# Patient Record
Sex: Female | Born: 1950 | Race: Asian | Hispanic: No | Marital: Married | Smoking: Never smoker
Health system: Southern US, Community
[De-identification: ages and names within clinical notes are randomized; demographics above are authoritative.]

## PROBLEM LIST (undated history)

## (undated) DIAGNOSIS — F329 Major depressive disorder, single episode, unspecified: Secondary | ICD-10-CM

## (undated) DIAGNOSIS — F32A Depression, unspecified: Secondary | ICD-10-CM

## (undated) DIAGNOSIS — E119 Type 2 diabetes mellitus without complications: Secondary | ICD-10-CM

## (undated) DIAGNOSIS — K219 Gastro-esophageal reflux disease without esophagitis: Secondary | ICD-10-CM

## (undated) DIAGNOSIS — M1990 Unspecified osteoarthritis, unspecified site: Secondary | ICD-10-CM

## (undated) DIAGNOSIS — N390 Urinary tract infection, site not specified: Secondary | ICD-10-CM

## (undated) DIAGNOSIS — E785 Hyperlipidemia, unspecified: Secondary | ICD-10-CM

## (undated) DIAGNOSIS — E079 Disorder of thyroid, unspecified: Secondary | ICD-10-CM

## (undated) HISTORY — DX: Type 2 diabetes mellitus without complications: E11.9

## (undated) HISTORY — DX: Gastro-esophageal reflux disease without esophagitis: K21.9

## (undated) HISTORY — DX: Major depressive disorder, single episode, unspecified: F32.9

## (undated) HISTORY — DX: Depression, unspecified: F32.A

## (undated) HISTORY — DX: Disorder of thyroid, unspecified: E07.9

## (undated) HISTORY — DX: Urinary tract infection, site not specified: N39.0

## (undated) HISTORY — DX: Unspecified osteoarthritis, unspecified site: M19.90

## (undated) HISTORY — DX: Hyperlipidemia, unspecified: E78.5

## (undated) HISTORY — PX: ESOPHAGOGASTRODUODENOSCOPY: SHX1529

---

## 2012-11-11 ENCOUNTER — Ambulatory Visit: Payer: Self-pay | Admitting: Family Medicine

## 2013-04-14 ENCOUNTER — Ambulatory Visit: Payer: Self-pay | Admitting: Family Medicine

## 2014-01-08 ENCOUNTER — Ambulatory Visit: Payer: Self-pay | Admitting: Family Medicine

## 2014-01-15 ENCOUNTER — Ambulatory Visit: Payer: Self-pay | Admitting: Internal Medicine

## 2014-01-15 LAB — CBC CANCER CENTER
Basophil: 1 %
HCT: 35.7 % (ref 35.0–47.0)
HGB: 10.9 g/dL — AB (ref 12.0–16.0)
Lymphocytes: 25 %
MCH: 21.1 pg — AB (ref 26.0–34.0)
MCHC: 30.6 g/dL — ABNORMAL LOW (ref 32.0–36.0)
MCV: 69 fL — AB (ref 80–100)
Monocytes: 8 %
Platelet: 24 x10 3/mm — CL (ref 150–440)
RBC: 5.19 10*6/uL (ref 3.80–5.20)
RDW: 14.7 % — AB (ref 11.5–14.5)
Segmented Neutrophils: 66 %
WBC: 6.7 x10 3/mm (ref 3.6–11.0)

## 2014-01-15 LAB — PROTIME-INR
INR: 0.9
PROTHROMBIN TIME: 11.6 s (ref 11.5–14.7)

## 2014-01-15 LAB — APTT: Activated PTT: 28.2 secs (ref 23.6–35.9)

## 2014-01-15 LAB — FOLATE: FOLIC ACID: 46.4 ng/mL (ref 3.1–100.0)

## 2014-01-15 LAB — FIBRIN DEGRADATION PROD.(ARMC ONLY): Fibrin Degradation Prod.: <10 ug/ml (ref 2.1–7.7)

## 2014-01-15 LAB — FIBRINOGEN: Fibrinogen: 249 mg/dL (ref 210–470)

## 2014-01-22 LAB — CBC CANCER CENTER
Basophil #: 0.1 x10 3/mm (ref 0.0–0.1)
Basophil %: 0.6 %
EOS ABS: 0.1 x10 3/mm (ref 0.0–0.7)
Eosinophil %: 0.5 %
HCT: 36 % (ref 35.0–47.0)
HGB: 11.1 g/dL — ABNORMAL LOW (ref 12.0–16.0)
LYMPHS ABS: 2.6 x10 3/mm (ref 1.0–3.6)
Lymphocyte %: 18.3 %
MCH: 20.8 pg — ABNORMAL LOW (ref 26.0–34.0)
MCHC: 30.8 g/dL — ABNORMAL LOW (ref 32.0–36.0)
MCV: 68 fL — ABNORMAL LOW (ref 80–100)
MONOS PCT: 5.1 %
Monocyte #: 0.7 x10 3/mm (ref 0.2–0.9)
NEUTROS PCT: 75.5 %
Neutrophil #: 10.8 x10 3/mm — ABNORMAL HIGH (ref 1.4–6.5)
Platelet: 115 x10 3/mm — ABNORMAL LOW (ref 150–440)
RBC: 5.33 10*6/uL — ABNORMAL HIGH (ref 3.80–5.20)
RDW: 14.6 % — ABNORMAL HIGH (ref 11.5–14.5)
WBC: 14.3 x10 3/mm — ABNORMAL HIGH (ref 3.6–11.0)

## 2014-02-04 ENCOUNTER — Ambulatory Visit: Payer: Self-pay | Admitting: Internal Medicine

## 2014-02-05 LAB — CBC CANCER CENTER
BASOS ABS: 0.1 x10 3/mm (ref 0.0–0.1)
Basophil %: 1.4 %
EOS ABS: 0.1 x10 3/mm (ref 0.0–0.7)
Eosinophil %: 1.9 %
HCT: 32.5 % — ABNORMAL LOW (ref 35.0–47.0)
HGB: 9.8 g/dL — ABNORMAL LOW (ref 12.0–16.0)
LYMPHS ABS: 2 x10 3/mm (ref 1.0–3.6)
Lymphocyte %: 32.4 %
MCH: 20.7 pg — AB (ref 26.0–34.0)
MCHC: 30.1 g/dL — AB (ref 32.0–36.0)
MCV: 69 fL — ABNORMAL LOW (ref 80–100)
MONO ABS: 0.4 x10 3/mm (ref 0.2–0.9)
Monocyte %: 6.8 %
Neutrophil #: 3.5 x10 3/mm (ref 1.4–6.5)
Neutrophil %: 57.5 %
PLATELETS: 65 x10 3/mm — AB (ref 150–440)
RBC: 4.72 10*6/uL (ref 3.80–5.20)
RDW: 14.8 % — ABNORMAL HIGH (ref 11.5–14.5)
WBC: 6 x10 3/mm (ref 3.6–11.0)

## 2014-02-19 LAB — CBC CANCER CENTER
Basophil #: 0.1 x10 3/mm (ref 0.0–0.1)
Basophil %: 1.2 %
EOS PCT: 1.5 %
Eosinophil #: 0.1 x10 3/mm (ref 0.0–0.7)
HCT: 34.2 % — ABNORMAL LOW (ref 35.0–47.0)
HGB: 10.7 g/dL — AB (ref 12.0–16.0)
LYMPHS ABS: 2.2 x10 3/mm (ref 1.0–3.6)
LYMPHS PCT: 32.8 %
MCH: 21.2 pg — ABNORMAL LOW (ref 26.0–34.0)
MCHC: 31.4 g/dL — ABNORMAL LOW (ref 32.0–36.0)
MCV: 68 fL — ABNORMAL LOW (ref 80–100)
Monocyte #: 0.5 x10 3/mm (ref 0.2–0.9)
Monocyte %: 7.2 %
Neutrophil #: 3.9 x10 3/mm (ref 1.4–6.5)
Neutrophil %: 57.3 %
Platelet: 86 x10 3/mm — ABNORMAL LOW (ref 150–440)
RBC: 5.06 10*6/uL (ref 3.80–5.20)
RDW: 14.7 % — ABNORMAL HIGH (ref 11.5–14.5)
WBC: 6.8 x10 3/mm (ref 3.6–11.0)

## 2014-03-07 ENCOUNTER — Ambulatory Visit: Payer: Self-pay | Admitting: Internal Medicine

## 2014-03-18 LAB — CBC CANCER CENTER
BASOS PCT: 0.9 %
Basophil #: 0.1 x10 3/mm (ref 0.0–0.1)
EOS ABS: 0.1 x10 3/mm (ref 0.0–0.7)
Eosinophil %: 1.5 %
HCT: 33.7 % — ABNORMAL LOW (ref 35.0–47.0)
HGB: 10.6 g/dL — ABNORMAL LOW (ref 12.0–16.0)
Lymphocyte #: 2.2 x10 3/mm (ref 1.0–3.6)
Lymphocyte %: 36 %
MCH: 20.8 pg — ABNORMAL LOW (ref 26.0–34.0)
MCHC: 31.4 g/dL — AB (ref 32.0–36.0)
MCV: 66 fL — ABNORMAL LOW (ref 80–100)
Monocyte #: 0.4 x10 3/mm (ref 0.2–0.9)
Monocyte %: 7.2 %
NEUTROS ABS: 3.3 x10 3/mm (ref 1.4–6.5)
NEUTROS PCT: 54.4 %
Platelet: 97 x10 3/mm — ABNORMAL LOW (ref 150–440)
RBC: 5.1 10*6/uL (ref 3.80–5.20)
RDW: 14.6 % — AB (ref 11.5–14.5)
WBC: 6.1 x10 3/mm (ref 3.6–11.0)

## 2014-03-23 LAB — PROT IMMUNOELECTROPHORES(ARMC)

## 2014-04-06 ENCOUNTER — Ambulatory Visit: Payer: Self-pay | Admitting: Internal Medicine

## 2014-04-15 LAB — CBC CANCER CENTER
BASOS PCT: 1 %
Basophil #: 0.1 x10 3/mm (ref 0.0–0.1)
Eosinophil #: 0.1 x10 3/mm (ref 0.0–0.7)
Eosinophil %: 1.5 %
HCT: 35.4 % (ref 35.0–47.0)
HGB: 10.9 g/dL — AB (ref 12.0–16.0)
Lymphocyte #: 2.2 x10 3/mm (ref 1.0–3.6)
Lymphocyte %: 26.3 %
MCH: 20.6 pg — ABNORMAL LOW (ref 26.0–34.0)
MCHC: 30.8 g/dL — AB (ref 32.0–36.0)
MCV: 67 fL — ABNORMAL LOW (ref 80–100)
MONO ABS: 0.5 x10 3/mm (ref 0.2–0.9)
Monocyte %: 5.7 %
Neutrophil #: 5.5 x10 3/mm (ref 1.4–6.5)
Neutrophil %: 65.5 %
Platelet: 158 x10 3/mm (ref 150–440)
RBC: 5.3 10*6/uL — ABNORMAL HIGH (ref 3.80–5.20)
RDW: 15.2 % — AB (ref 11.5–14.5)
WBC: 8.5 x10 3/mm (ref 3.6–11.0)

## 2014-05-07 ENCOUNTER — Ambulatory Visit: Payer: Self-pay | Admitting: Internal Medicine

## 2015-01-28 ENCOUNTER — Other Ambulatory Visit: Payer: Self-pay | Admitting: Family Medicine

## 2015-01-28 DIAGNOSIS — M810 Age-related osteoporosis without current pathological fracture: Secondary | ICD-10-CM

## 2015-01-28 DIAGNOSIS — Z1231 Encounter for screening mammogram for malignant neoplasm of breast: Secondary | ICD-10-CM

## 2015-02-10 ENCOUNTER — Ambulatory Visit
Admission: RE | Admit: 2015-02-10 | Discharge: 2015-02-10 | Disposition: A | Discharge status: Home / Self Care | Payer: No Typology Code available for payment source | Source: Ambulatory Visit | Attending: Family Medicine | Admitting: Family Medicine

## 2015-02-10 DIAGNOSIS — M8580 Other specified disorders of bone density and structure, unspecified site: Secondary | ICD-10-CM | POA: Insufficient documentation

## 2015-02-10 DIAGNOSIS — M810 Age-related osteoporosis without current pathological fracture: Secondary | ICD-10-CM | POA: Insufficient documentation

## 2015-02-10 DIAGNOSIS — Z1231 Encounter for screening mammogram for malignant neoplasm of breast: Secondary | ICD-10-CM | POA: Diagnosis not present

## 2015-12-13 ENCOUNTER — Ambulatory Visit (INDEPENDENT_AMBULATORY_CARE_PROVIDER_SITE_OTHER): Payer: BLUE CROSS/BLUE SHIELD | Admitting: Family

## 2015-12-13 ENCOUNTER — Encounter: Payer: Self-pay | Admitting: Family

## 2015-12-13 VITALS — BP 138/70 | HR 71 | Temp 98.1°F | Wt 130.6 lb

## 2015-12-13 DIAGNOSIS — Z0000 Encounter for general adult medical examination without abnormal findings: Secondary | ICD-10-CM

## 2015-12-13 DIAGNOSIS — M810 Age-related osteoporosis without current pathological fracture: Secondary | ICD-10-CM

## 2015-12-13 DIAGNOSIS — M1990 Unspecified osteoarthritis, unspecified site: Secondary | ICD-10-CM | POA: Diagnosis not present

## 2015-12-13 DIAGNOSIS — E119 Type 2 diabetes mellitus without complications: Secondary | ICD-10-CM

## 2015-12-13 DIAGNOSIS — E785 Hyperlipidemia, unspecified: Secondary | ICD-10-CM

## 2015-12-13 DIAGNOSIS — K219 Gastro-esophageal reflux disease without esophagitis: Secondary | ICD-10-CM

## 2015-12-13 MED ORDER — DICLOFENAC SODIUM 1 % TD GEL: 4.0000 g | Freq: Four times a day (QID) | TRANSDERMAL | 3 refills | Status: DC

## 2015-12-13 NOTE — Assessment & Plan Note (Signed)
On statin. Pending lipid panel.

## 2015-12-13 NOTE — Assessment & Plan Note (Signed)
Generalized, predominately in patient's hands. Trial of Voltaren gel. Encouraged continued exercise, and heat.

## 2015-12-13 NOTE — Assessment & Plan Note (Signed)
On Fosamax since 2016. Due for DEXA scan 2018. Will follow.

## 2015-12-13 NOTE — Patient Instructions (Signed)
Pleasure meeting you. We will notify you of the results.  Let's treat conservatively as we discussed.   Over-the-counter medications you may try for arthritic pain include:   ThermaCare patches   Capsaicin cream   Icy hot  Home exercises as we discussed below/handout.  If conservative treatment doesn't yield results, we will consider physical therapy, consult to Sports Medicine/Orthopedics for further evaluation, and imaging.   If there is no improvement in your symptoms, or if there is any worsening of symptoms, or if you have any additional concerns, please return for re-evaluation; or, if we are closed, consider going to the Emergency Room for evaluation if symptoms urgent.  Menopause is a normal process in which your reproductive ability comes to an end. This process happens gradually over a span of months to years, usually between the ages of 77 and 56. Menopause is complete when you have missed 12 consecutive menstrual periods. It is important to talk with your health care provider about some of the most common conditions that affect postmenopausal women, such as heart disease, cancer, and bone loss (osteoporosis). Adopting a healthy lifestyle and getting preventive care can help to promote your health and wellness. Those actions can also lower your chances of developing some of these common conditions. WHAT SHOULD I KNOW ABOUT MENOPAUSE? During menopause, you may experience a number of symptoms, such as:  Moderate-to-severe hot flashes.  Night sweats.  Decrease in sex drive.  Mood swings.  Headaches.  Tiredness.  Irritability.  Memory problems.  Insomnia. Choosing to treat or not to treat menopausal changes is an individual decision that you make with your health care provider. WHAT SHOULD I KNOW ABOUT HORMONE REPLACEMENT THERAPY AND SUPPLEMENTS? Hormone therapy products are effective for treating symptoms that are associated with menopause, such as hot flashes and  night sweats. Hormone replacement carries certain risks, especially as you become older. If you are thinking about using estrogen or estrogen with progestin treatments, discuss the benefits and risks with your health care provider. WHAT SHOULD I KNOW ABOUT HEART DISEASE AND STROKE? Heart disease, heart attack, and stroke become more likely as you age. This may be due, in part, to the hormonal changes that your body experiences during menopause. These can affect how your body processes dietary fats, triglycerides, and cholesterol. Heart attack and stroke are both medical emergencies. There are many things that you can do to help prevent heart disease and stroke:  Have your blood pressure checked at least every 1-2 years. High blood pressure causes heart disease and increases the risk of stroke.  If you are 19-22 years old, ask your health care provider if you should take aspirin to prevent a heart attack or a stroke.  Do not use any tobacco products, including cigarettes, chewing tobacco, or electronic cigarettes. If you need help quitting, ask your health care provider.  It is important to eat a healthy diet and maintain a healthy weight.  Be sure to include plenty of vegetables, fruits, low-fat dairy products, and lean protein.  Avoid eating foods that are high in solid fats, added sugars, or salt (sodium).  Get regular exercise. This is one of the most important things that you can do for your health.  Try to exercise for at least 150 minutes each week. The type of exercise that you do should increase your heart rate and make you sweat. This is known as moderate-intensity exercise.  Try to do strengthening exercises at least twice each week. Do these in addition  to the moderate-intensity exercise.  Know your numbers.Ask your health care provider to check your cholesterol and your blood glucose. Continue to have your blood tested as directed by your health care provider. WHAT SHOULD I KNOW  ABOUT CANCER SCREENING? There are several types of cancer. Take the following steps to reduce your risk and to catch any cancer development as early as possible. Breast Cancer  Practice breast self-awareness.  This means understanding how your breasts normally appear and feel.  It also means doing regular breast self-exams. Let your health care provider know about any changes, no matter how small.  If you are 40 or older, have a clinician do a breast exam (clinical breast exam or CBE) every year. Depending on your age, family history, and medical history, it may be recommended that you also have a yearly breast X-ray (mammogram).  If you have a family history of breast cancer, talk with your health care provider about genetic screening.  If you are at high risk for breast cancer, talk with your health care provider about having an MRI and a mammogram every year.  Breast cancer (BRCA) gene test is recommended for women who have family members with BRCA-related cancers. Results of the assessment will determine the need for genetic counseling and BRCA1 and for BRCA2 testing. BRCA-related cancers include these types:  Breast. This occurs in males or females.  Ovarian.  Tubal. This may also be called fallopian tube cancer.  Cancer of the abdominal or pelvic lining (peritoneal cancer).  Prostate.  Pancreatic. Cervical, Uterine, and Ovarian Cancer Your health care provider may recommend that you be screened regularly for cancer of the pelvic organs. These include your ovaries, uterus, and vagina. This screening involves a pelvic exam, which includes checking for microscopic changes to the surface of your cervix (Pap test).  For women ages 21-65, health care providers may recommend a pelvic exam and a Pap test every three years. For women ages 54-65, they may recommend the Pap test and pelvic exam, combined with testing for human papilloma virus (HPV), every five years. Some types of HPV  increase your risk of cervical cancer. Testing for HPV may also be done on women of any age who have unclear Pap test results.  Other health care providers may not recommend any screening for nonpregnant women who are considered low risk for pelvic cancer and have no symptoms. Ask your health care provider if a screening pelvic exam is right for you.  If you have had past treatment for cervical cancer or a condition that could lead to cancer, you need Pap tests and screening for cancer for at least 20 years after your treatment. If Pap tests have been discontinued for you, your risk factors (such as having a new sexual partner) need to be reassessed to determine if you should start having screenings again. Some women have medical problems that increase the chance of getting cervical cancer. In these cases, your health care provider may recommend that you have screening and Pap tests more often.  If you have a family history of uterine cancer or ovarian cancer, talk with your health care provider about genetic screening.  If you have vaginal bleeding after reaching menopause, tell your health care provider.  There are currently no reliable tests available to screen for ovarian cancer. Lung Cancer Lung cancer screening is recommended for adults 48-56 years old who are at high risk for lung cancer because of a history of smoking. A yearly low-dose CT scan  of the lungs is recommended if you:  Currently smoke.  Have a history of at least 30 pack-years of smoking and you currently smoke or have quit within the past 15 years. A pack-year is smoking an average of one pack of cigarettes per day for one year. Yearly screening should:  Continue until it has been 15 years since you quit.  Stop if you develop a health problem that would prevent you from having lung cancer treatment. Colorectal Cancer  This type of cancer can be detected and can often be prevented.  Routine colorectal cancer screening  usually begins at age 75 and continues through age 64.  If you have risk factors for colon cancer, your health care provider may recommend that you be screened at an earlier age.  If you have a family history of colorectal cancer, talk with your health care provider about genetic screening.  Your health care provider may also recommend using home test kits to check for hidden blood in your stool.  A small camera at the end of a tube can be used to examine your colon directly (sigmoidoscopy or colonoscopy). This is done to check for the earliest forms of colorectal cancer.  Direct examination of the colon should be repeated every 5-10 years until age 7. However, if early forms of precancerous polyps or small growths are found or if you have a family history or genetic risk for colorectal cancer, you may need to be screened more often. Skin Cancer  Check your skin from head to toe regularly.  Monitor any moles. Be sure to tell your health care provider:  About any new moles or changes in moles, especially if there is a change in a mole's shape or color.  If you have a mole that is larger than the size of a pencil eraser.  If any of your family members has a history of skin cancer, especially at a young age, talk with your health care provider about genetic screening.  Always use sunscreen. Apply sunscreen liberally and repeatedly throughout the day.  Whenever you are outside, protect yourself by wearing long sleeves, pants, a wide-brimmed hat, and sunglasses. WHAT SHOULD I KNOW ABOUT OSTEOPOROSIS? Osteoporosis is a condition in which bone destruction happens more quickly than new bone creation. After menopause, you may be at an increased risk for osteoporosis. To help prevent osteoporosis or the bone fractures that can happen because of osteoporosis, the following is recommended:  If you are 36-87 years old, get at least 1,000 mg of calcium and at least 600 mg of vitamin D per day.  If  you are older than age 49 but younger than age 66, get at least 1,200 mg of calcium and at least 600 mg of vitamin D per day.  If you are older than age 38, get at least 1,200 mg of calcium and at least 800 mg of vitamin D per day. Smoking and excessive alcohol intake increase the risk of osteoporosis. Eat foods that are rich in calcium and vitamin D, and do weight-bearing exercises several times each week as directed by your health care provider. WHAT SHOULD I KNOW ABOUT HOW MENOPAUSE AFFECTS [place]? Depression may occur at any age, but it is more common as you become older. Common symptoms of depression include:  Low or sad mood.  Changes in sleep patterns.  Changes in appetite or eating patterns.  Feeling an overall lack of motivation or enjoyment of activities that you previously enjoyed.  Frequent  crying spells. Talk with your health care provider if you think that you are experiencing depression. WHAT SHOULD I KNOW ABOUT IMMUNIZATIONS? It is important that you get and maintain your immunizations. These include:  Tetanus, diphtheria, and pertussis (Tdap) booster vaccine.  Influenza every year before the flu season begins.  Pneumonia vaccine.  Shingles vaccine. Your health care provider may also recommend other immunizations.   This information is not intended to replace advice given to you by your health care provider. Make sure you discuss any questions you have with your health care provider.   Document Released: 06/15/2005 Document Revised: 05/14/2014 Document Reviewed: 12/24/2013 Elsevier Interactive Patient Education [employer].

## 2015-12-13 NOTE — Progress Notes (Addendum)
Subjective:    Patient ID: Janet Moyer, female    DOB: 02/01/1951, 65 y.o.   MRN: [id number]  CC: Janet Maplesnjana D Woodbeck is a 65 y.o. female who presents today for physical exam.    HPI: Here to establish care, prior care with [facility]. Requesting records. Accompanied by daughter. 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 Has been by hematology in past.  Overall feels well. Only complaint is general joint pain and arthritis over past year. Knees, ankles, hip, and low which prevents her from exercise. Hasn't tried any medication. Walks two miles each morning.     Colorectal Cancer Screening: UTD. Last 2009, normal per patient. Not due until 10 years.  Breast Cancer Screening: Mammogram UTD, 2017. Normal. No h/o abnormal.  Cervical Cancer Screening: UTD. 01/2015, normal. Had had prior abnormal.  Bone Health screening/DEXA for 65+: 02/2015 T score -3.5 at spine, osteoporotic. On fosamax weekly. Started treatment 2016.   Immunizations       Tetanus -UTD.          Pneumococcal - 2014.  Hepatitis C screening - Declines; had done with [facility]- Declines; had done with [facility]: Screening labs today. Exercise: Gets regular exercise.  Alcohol use: None Smoking/tobacco use: Nonsmoker.  Regular dental exams: In need of dental exam. Wears seat belt: Yes.  HISTORY:  Past Medical History:  Diagnosis Date  . Arthritis   . Depression   . Diabetes mellitus without complication (HCC)   . GERD (gastroesophageal reflux disease)   . Hyperlipidemia   . Hyperlipidemia   . Thyroid disorder   . UTI (lower urinary tract infection)     Past Surgical History:  Procedure Laterality Date  . CESAREAN SECTION    . ESOPHAGOGASTRODUODENOSCOPY     Family History  Problem Relation Age of Onset  . Hyperlipidemia Mother   . Hypertension Mother   . Diabetes Mother   . Hyperlipidemia Father   . Stroke Father   . Hypertension Father   . Diabetes Father       ALLERGIES: Review of  patient's allergies indicates not on file.  No current outpatient prescriptions on file prior to visit.   No current facility-administered medications on file prior to visit.     Social History  Substance Use Topics  . Smoking status: Never Smoker  . Smokeless tobacco: Never Used  . Alcohol use No    Review of Systems  Constitutional: Negative for chills and fever.  Respiratory: Negative for cough.   Cardiovascular: Negative for chest pain and palpitations.  Gastrointestinal: Negative for nausea and vomiting.  Musculoskeletal: Positive for back pain. Negative for gait problem, joint swelling and myalgias.  All other systems negative.     Objective:    BP 138/70 (BP Location: Left Arm, Patient Position: Sitting, Cuff Size: Normal)   Pulse 71   Temp 98.1 F (36.7 C) (Oral)   Wt 130 lb 9.6 oz (59.2 kg)   SpO2 99%   BP Readings from Last 3 Encounters:  12/13/15 138/70   Wt Readings from Last 3 Encounters:  12/13/15 130 lb 9.6 oz (59.2 kg)    Physical Exam  Constitutional: She appears well-developed and well-nourished.  Eyes: Conjunctivae are normal.  Cardiovascular: Normal rate, regular rhythm, normal heart sounds and normal pulses.   Pulmonary/Chest: Effort normal and breath sounds normal. She has no wheezes. She has no rhonchi. She has no rales.  Musculoskeletal:  Grip strength normal and symmetric bilaterally. Noted ulnar deviation bilaterally.  Neurological: She is alert.  Skin: Skin is warm and dry.  Psychiatric: She has a normal mood and affect. Her speech is normal and behavior is normal. Thought content normal.  Vitals reviewed.      Assessment & Plan:   Problem List Items Addressed This Visit      Digestive   GERD (gastroesophageal reflux disease)    Well-controlled on omeprazole. Will follow.      Relevant Medications   omeprazole (PRILOSEC) 40 MG capsule     Endocrine   Type 2 diabetes mellitus (HCC)    Patient is currently on metformin.  Pending A1c.      Relevant Medications   simvastatin (ZOCOR) 10 MG tablet   metFORMIN (GLUCOPHAGE) 500 MG tablet     Musculoskeletal and Integument   Arthritis    Generalized, predominately in patient's hands. Trial of Voltaren gel. Encouraged continued exercise, and heat.      Relevant Medications   diclofenac sodium (VOLTAREN) 1 % GEL   Osteoporosis    On Fosamax since 2016. Due for DEXA scan 2018. Will follow.       Relevant Medications   Calcium Carbonate-Vitamin D 600-400 MG-UNIT tablet   Cholecalciferol (VITAMIN D3) 1000 units CAPS   alendronate (FOSAMAX) 70 MG tablet     Other   Routine physical examination - Primary    Still awaiting records. However per patient and daughter patient is up-to-date on screening. She is taking Fosamax. We will do screening labs today.      Relevant Orders   CBC with Differential/Platelet   Comprehensive metabolic panel   Hemoglobin A1c   Lipid panel   Microalbumin / creatinine urine ratio   TSH   VITAMIN D 25 Hydroxy (Vit-D Deficiency, Fractures)   Hyperlipidemia    On statin. Pending lipid panel.      Relevant Medications   simvastatin (ZOCOR) 10 MG tablet    Other Visit Diagnoses   None.      I am having Ms. Suits start on diclofenac sodium. I am also having her maintain her Calcium Carbonate-Vitamin D, Cyanocobalamin, simvastatin, metFORMIN, omeprazole, CENTRUM SILVER ULTRA WOMENS, Vitamin D3, alendronate, and folic acid.   Meds ordered this encounter  Medications  . Calcium Carbonate-Vitamin D 600-400 MG-UNIT tablet    Sig: Take by mouth.  . Cyanocobalamin (RA VITAMIN B-12 TR) 1000 MCG TBCR    Sig: Take by mouth.  . simvastatin (ZOCOR) 10 MG tablet    Sig: Take by mouth.  . metFORMIN (GLUCOPHAGE) 500 MG tablet    Sig: TAKE 1 TABLET BY MOUTH EVERY DAY WITH BREAKFAST FOR 1 WEEK THEN TAKE 1 TABLET BY MOUTH TWICE DAILY  . omeprazole (PRILOSEC) 40 MG capsule    Sig: Take by mouth.  . Multiple Vitamins-Minerals  (CENTRUM SILVER ULTRA WOMENS) TABS    Sig: Take by mouth.  . Cholecalciferol (VITAMIN D3) 1000 units CAPS    Sig: Take by mouth.  Marland Kitchen alendronate (FOSAMAX) 70 MG tablet    Sig: Take by mouth.  . folic acid (FOLVITE) 800 MCG tablet    Sig: Take 400 mcg by mouth daily.  . diclofenac sodium (VOLTAREN) 1 % GEL    Sig: Apply 4 g topically 4 (four) times daily.    Dispense:  1 Tube    Refill:  3    Order Specific Question:   Supervising Provider    Answer:   Sherlene Shams [[id number]]    Return precautions given.   Risks, benefits, and alternatives  of the medications and treatment plan prescribed today were discussed, and patient expressed understanding.   Education regarding symptom management and diagnosis given to patient on AVS.   Continue to follow with Rennie Plowman, FNP for routine health maintenance.   Janet Maples and I agreed with plan.   Rennie Plowman, FNP

## 2015-12-13 NOTE — Assessment & Plan Note (Signed)
Well-controlled on omeprazole. Will follow.

## 2015-12-13 NOTE — Assessment & Plan Note (Signed)
Patient is currently on metformin. Pending A1c.

## 2015-12-13 NOTE — Assessment & Plan Note (Signed)
Still awaiting records. However per patient and daughter patient is up-to-date on screening. She is taking Fosamax. We will do screening labs today.

## 2015-12-14 ENCOUNTER — Other Ambulatory Visit (INDEPENDENT_AMBULATORY_CARE_PROVIDER_SITE_OTHER): Payer: BLUE CROSS/BLUE SHIELD

## 2015-12-14 DIAGNOSIS — Z0000 Encounter for general adult medical examination without abnormal findings: Secondary | ICD-10-CM | POA: Diagnosis not present

## 2015-12-14 LAB — CBC WITH DIFFERENTIAL/PLATELET
BASOS ABS: 0 10*3/uL (ref 0.0–0.1)
BASOS PCT: 0.5 % (ref 0.0–3.0)
EOS ABS: 0.1 10*3/uL (ref 0.0–0.7)
Eosinophils Relative: 2.4 % (ref 0.0–5.0)
HEMATOCRIT: 33.4 % — AB (ref 36.0–46.0)
HEMOGLOBIN: 10.6 g/dL — AB (ref 12.0–15.0)
LYMPHS PCT: 35.4 % (ref 12.0–46.0)
Lymphs Abs: 1.9 10*3/uL (ref 0.7–4.0)
MCHC: 31.6 g/dL (ref 30.0–36.0)
MCV: 67.4 fl — ABNORMAL LOW (ref 78.0–100.0)
Monocytes Absolute: 0.3 10*3/uL (ref 0.1–1.0)
Monocytes Relative: 6.4 % (ref 3.0–12.0)
Neutro Abs: 3 10*3/uL (ref 1.4–7.7)
Neutrophils Relative %: 55.3 % (ref 43.0–77.0)
Platelets: 217 10*3/uL (ref 150.0–400.0)
RBC: 4.96 Mil/uL (ref 3.87–5.11)
RDW: 15.3 % (ref 11.5–15.5)
WBC: 5.4 10*3/uL (ref 4.0–10.5)

## 2015-12-14 LAB — LDL CHOLESTEROL, DIRECT: LDL DIRECT: 77 mg/dL

## 2015-12-14 LAB — COMPREHENSIVE METABOLIC PANEL
ALBUMIN: 4 g/dL (ref 3.5–5.2)
ALT: 24 U/L (ref 0–35)
AST: 16 U/L (ref 0–37)
Alkaline Phosphatase: 51 U/L (ref 39–117)
BILIRUBIN TOTAL: 0.7 mg/dL (ref 0.2–1.2)
BUN: 15 mg/dL (ref 6–23)
CALCIUM: 9.4 mg/dL (ref 8.4–10.5)
CO2: 31 mEq/L (ref 19–32)
CREATININE: 0.61 mg/dL (ref 0.40–1.20)
Chloride: 101 mEq/L (ref 96–112)
GFR: 105.12 mL/min (ref >60.00)
Glucose, Bld: 183 mg/dL — ABNORMAL HIGH (ref 70–99)
Potassium: 4.1 mEq/L (ref 3.5–5.1)
Sodium: 136 mEq/L (ref 135–145)
TOTAL PROTEIN: 7 g/dL (ref 6.0–8.3)

## 2015-12-14 LAB — LIPID PANEL
CHOL/HDL RATIO: 3
CHOLESTEROL: 159 mg/dL (ref 0–200)
HDL: 51.8 mg/dL (ref >39.00)
NonHDL: 106.83
Triglycerides: 209 mg/dL — ABNORMAL HIGH (ref 0.0–149.0)
VLDL: 41.8 mg/dL — ABNORMAL HIGH (ref 0.0–40.0)

## 2015-12-14 LAB — TSH: TSH: 5.14 u[IU]/mL — ABNORMAL HIGH (ref 0.35–4.50)

## 2015-12-14 LAB — HEMOGLOBIN A1C: HEMOGLOBIN A1C: 8.6 % — AB (ref 4.6–6.5)

## 2015-12-14 LAB — MICROALBUMIN / CREATININE URINE RATIO
CREATININE, U: 47.6 mg/dL
MICROALB/CREAT RATIO: 1.5 mg/g (ref 0.0–30.0)

## 2015-12-14 LAB — VITAMIN D 25 HYDROXY (VIT D DEFICIENCY, FRACTURES): VITD: 93.52 ng/mL (ref 30.00–100.00)

## 2015-12-15 ENCOUNTER — Telehealth: Payer: Self-pay | Admitting: Family

## 2015-12-15 ENCOUNTER — Telehealth: Payer: Self-pay

## 2015-12-15 DIAGNOSIS — D649 Anemia, unspecified: Secondary | ICD-10-CM

## 2015-12-15 DIAGNOSIS — E785 Hyperlipidemia, unspecified: Secondary | ICD-10-CM

## 2015-12-15 DIAGNOSIS — R7989 Other specified abnormal findings of blood chemistry: Secondary | ICD-10-CM

## 2015-12-15 DIAGNOSIS — E1110 Type 2 diabetes mellitus with ketoacidosis without coma: Secondary | ICD-10-CM

## 2015-12-15 MED ORDER — SIMVASTATIN 20 MG PO TABS: 20.0000 mg | ORAL_TABLET | Freq: Every evening | ORAL | 4 refills | Status: DC

## 2015-12-15 NOTE — Telephone Encounter (Signed)
Completed PA for Voltren on Cover my meds.

## 2015-12-15 NOTE — Telephone Encounter (Signed)
See result note.  

## 2016-01-18 ENCOUNTER — Ambulatory Visit (INDEPENDENT_AMBULATORY_CARE_PROVIDER_SITE_OTHER): Payer: BLUE CROSS/BLUE SHIELD | Admitting: Family

## 2016-01-18 ENCOUNTER — Encounter: Payer: Self-pay | Admitting: Family

## 2016-01-18 VITALS — BP 116/72 | HR 86 | Temp 97.8°F | Wt 129.8 lb

## 2016-01-18 DIAGNOSIS — E785 Hyperlipidemia, unspecified: Secondary | ICD-10-CM

## 2016-01-18 DIAGNOSIS — Z23 Encounter for immunization: Secondary | ICD-10-CM

## 2016-01-18 DIAGNOSIS — D649 Anemia, unspecified: Secondary | ICD-10-CM

## 2016-01-18 DIAGNOSIS — D563 Thalassemia minor: Secondary | ICD-10-CM | POA: Diagnosis not present

## 2016-01-18 DIAGNOSIS — E119 Type 2 diabetes mellitus without complications: Secondary | ICD-10-CM | POA: Diagnosis not present

## 2016-01-18 DIAGNOSIS — L819 Disorder of pigmentation, unspecified: Secondary | ICD-10-CM | POA: Insufficient documentation

## 2016-01-18 LAB — CBC WITH DIFFERENTIAL/PLATELET
BASOS ABS: 0 10*3/uL (ref 0.0–0.1)
Basophils Relative: 0.4 % (ref 0.0–3.0)
EOS ABS: 0.1 10*3/uL (ref 0.0–0.7)
Eosinophils Relative: 2.9 % (ref 0.0–5.0)
HCT: 34.8 % — ABNORMAL LOW (ref 36.0–46.0)
Hemoglobin: 11.2 g/dL — ABNORMAL LOW (ref 12.0–15.0)
LYMPHS ABS: 1.8 10*3/uL (ref 0.7–4.0)
LYMPHS PCT: 36 % (ref 12.0–46.0)
MCHC: 32.1 g/dL (ref 30.0–36.0)
MCV: 66 fl — ABNORMAL LOW (ref 78.0–100.0)
Monocytes Absolute: 0.4 10*3/uL (ref 0.1–1.0)
Monocytes Relative: 7.4 % (ref 3.0–12.0)
NEUTROS ABS: 2.7 10*3/uL (ref 1.4–7.7)
NEUTROS PCT: 53.3 % (ref 43.0–77.0)
PLATELETS: 254 10*3/uL (ref 150.0–400.0)
RBC: 5.28 Mil/uL — ABNORMAL HIGH (ref 3.87–5.11)
RDW: 15.5 % (ref 11.5–15.5)
WBC: 5 10*3/uL (ref 4.0–10.5)

## 2016-01-18 LAB — IBC PANEL
IRON: 104 ug/dL (ref 42–145)
Saturation Ratios: 31.3 % (ref 20.0–50.0)
TRANSFERRIN: 237 mg/dL (ref 212.0–360.0)

## 2016-01-18 LAB — B12 AND FOLATE PANEL
Folate: >23.7 ng/mL (ref >5.9)
Vitamin B-12: 929 pg/mL — ABNORMAL HIGH (ref 211–911)

## 2016-01-18 LAB — FERRITIN: Ferritin: 63.9 ng/mL (ref 10.0–291.0)

## 2016-01-18 LAB — TSH: TSH: 5.74 u[IU]/mL — AB (ref 0.35–4.50)

## 2016-01-18 MED ORDER — ONETOUCH ULTRA MINI W/DEVICE KIT: PACK | 0 refills | Status: DC

## 2016-01-18 MED ORDER — GLUCOSE BLOOD VI STRP: ORAL_STRIP | 3 refills | Status: DC

## 2016-01-18 MED ORDER — METFORMIN HCL 1000 MG PO TABS: 1000.0000 mg | ORAL_TABLET | Freq: Two times a day (BID) | ORAL | 3 refills | Status: DC

## 2016-01-18 MED ORDER — ONETOUCH LANCETS MISC: 3 refills | Status: DC

## 2016-01-18 NOTE — Assessment & Plan Note (Signed)
Uncontrolled. Increased metformin. Referred to diabetic nutritionist and ordered diabetic supplies for patient to test blood sugar at home. Follow up in 3 months.

## 2016-01-18 NOTE — Assessment & Plan Note (Addendum)
Stable. Advised to not take iron. No acute bleeding source identified. Hemoglobin appears at patient's baseline. Microcytic anemia supports thalassemia. Pending anemia studies. Advised patient to go back to hematology due to my concern for blood malignancies after learning the h/o of thrombocytopenia as well. I would like to ensure that patient has been appropriately worked up and followed by heme-onc. Daughter and patient agreed with this plan.

## 2016-01-18 NOTE — Assessment & Plan Note (Addendum)
Nonspecific at this time. Chronic. Lesion not c/w petechiae or ecchymosis. Will follow and advised patient to discuss with heme-onc as well.

## 2016-01-18 NOTE — Patient Instructions (Signed)
As we discussed, I would like for you to contact hematology to ensure you were released from care. Usually they like to follow.   Basic Carbohydrate Counting for Diabetes Mellitus Carbohydrate counting is a method for keeping track of the amount of carbohydrates you eat. Eating carbohydrates naturally increases the level of sugar (glucose) in your blood, so it is important for you to know the amount that is okay for you to have in every meal. Carbohydrate counting helps keep the level of glucose in your blood within normal limits. The amount of carbohydrates allowed is different for every person. A dietitian can help you calculate the amount that is right for you. Once you know the amount of carbohydrates you can have, you can count the carbohydrates in the foods you want to eat. Carbohydrates are found in the following foods:  Grains, such as breads and cereals.  Dried beans and soy products.  Starchy vegetables, such as potatoes, peas, and corn.  Fruit and fruit juices.  Milk and yogurt.  Sweets and snack foods, such as cake, cookies, candy, chips, soft drinks, and fruit drinks. CARBOHYDRATE COUNTING There are two ways to count the carbohydrates in your food. You can use either of the methods or a combination of both. Reading the "Nutrition Facts" on Packaged Food The "Nutrition Facts" is an area that is included on the labels of almost all packaged food and beverages in the [place]. It includes the serving size of that food or beverage and information about the nutrients in each serving of the food, including the grams (g) of carbohydrate per serving.  Decide the number of servings of this food or beverage that you will be able to eat or drink. Multiply that number of servings by the number of grams of carbohydrate that is listed on the label for that serving. The total will be the amount of carbohydrates you will be having when you eat or drink this food or beverage. Learning Standard  Serving Sizes of Food When you eat food that is not packaged or does not include "Nutrition Facts" on the label, you need to measure the servings in order to count the amount of carbohydrates.A serving of most carbohydrate-rich foods contains about 15 g of carbohydrates. The following list includes serving sizes of carbohydrate-rich foods that provide 15 g ofcarbohydrate per serving:   1 slice of bread (1 oz) or 1 six-inch tortilla.    of a hamburger bun or English muffin.  4-6 crackers.   cup unsweetened dry cereal.    cup hot cereal.   cup rice or pasta.    cup mashed potatoes or  of a large baked potato.  1 cup fresh fruit or one small piece of fruit.    cup canned or frozen fruit or fruit juice.  1 cup milk.   cup plain fat-free yogurt or yogurt sweetened with artificial sweeteners.   cup cooked dried beans or starchy vegetable, such as peas, corn, or potatoes.  Decide the number of standard-size servings that you will eat. Multiply that number of servings by 15 (the grams of carbohydrates in that serving). For example, if you eat 2 cups of strawberries, you will have eaten 2 servings and 30 g of carbohydrates (2 servings x 15 g = 30 g). For foods such as soups and casseroles, in which more than one food is mixed in, you will need to count the carbohydrates in each food that is included. EXAMPLE OF CARBOHYDRATE COUNTING Sample Dinner  3 oz chicken breast.   cup of brown rice.   cup of corn.  1 cup milk.   1 cup strawberries with sugar-free whipped topping.  Carbohydrate Calculation Step 1: Identify the foods that contain carbohydrates:   Rice.   Corn.   Milk.   Strawberries. Step 2:Calculate the number of servings eaten of each:   2 servings of rice.   1 serving of corn.   1 serving of milk.   1 serving of strawberries. Step 3: Multiply each of those number of servings by 15 g:   2 servings of rice x 15 g = 30 g.   1  serving of corn x 15 g = 15 g.   1 serving of milk x 15 g = 15 g.   1 serving of strawberries x 15 g = 15 g. Step 4: Add together all of the amounts to find the total grams of carbohydrates eaten: 30 g + 15 g + 15 g + 15 g = 75 g.   This information is not intended to replace advice given to you by your health care provider. Make sure you discuss any questions you have with your health care provider.   Document Released: 04/23/2005 Document Revised: 05/14/2014 Document Reviewed: 03/20/2013 Elsevier Interactive Patient Education [employer].

## 2016-01-18 NOTE — Progress Notes (Signed)
Pre visit review using our clinic review tool, if applicable. No additional management support is needed unless otherwise documented below in the visit note. 

## 2016-01-18 NOTE — Progress Notes (Signed)
Subjective:    Patient ID: Janet Moyer, female    DOB: 1951-02-09, 65 y.o.   MRN: [id number]  CC: Janet Moyer is a 65 y.o. female who presents today for follow up.   HPI: Patient for follow-up on chronic disease. Accompanied by her daughter who is an Therapist, sports.  Anemia- h/o thalessmia minor and work up per daughter; takes folic acid and not iron. Has been worked up by [facility] [phone] by Dr Juliann Mule for low platelets and hemoglobin. At one time had black stool and had an EGD, no dark stools now. No SOB, fatigue.  Per daughter, 01/15/2014 hemoglobin 10.9. No blood in stool, vaginal bleeding.   HLD- increased Zocor at last visit. Compliant with medication.   DM- A1C 8.6. Doesn't check blood sugars as glucometer broken.  TSH elevated at last visit. no palpitations or h/o thyroid disease.   Missing records; patient states she colonoscopy in 2009, normal per patient.      HISTORY:  Past Medical History:  Diagnosis Date  . Arthritis   . Depression   . Diabetes mellitus without complication ([place])   . GERD (gastroesophageal reflux disease)   . Hyperlipidemia   . Hyperlipidemia   . Thyroid disorder   . UTI (lower urinary tract infection)    Past Surgical History:  Procedure Laterality Date  . CESAREAN SECTION    . ESOPHAGOGASTRODUODENOSCOPY     Family History  Problem Relation Age of Onset  . Hyperlipidemia Mother   . Hypertension Mother   . Diabetes Mother   . Hyperlipidemia Father   . Stroke Father   . Hypertension Father   . Diabetes Father     Allergies: Review of patient's allergies indicates not on file. Current Outpatient Prescriptions on File Prior to Visit  Medication Sig Dispense Refill  . alendronate (FOSAMAX) 70 MG tablet Take by mouth.    . Calcium Carbonate-Vitamin D 600-400 MG-UNIT tablet Take by mouth.    . Cholecalciferol (VITAMIN D3) 1000 units CAPS Take by mouth.    . Cyanocobalamin (RA VITAMIN B-12 TR) 1000 MCG TBCR Take by mouth.    . diclofenac  sodium (VOLTAREN) 1 % GEL Apply 4 g topically 4 (four) times daily. 1 Tube 3  . folic acid (FOLVITE) 412 MCG tablet Take 400 mcg by mouth daily.    . Multiple Vitamins-Minerals (CENTRUM SILVER ULTRA WOMENS) TABS Take by mouth.    Marland Kitchen omeprazole (PRILOSEC) 40 MG capsule Take by mouth.    . simvastatin (ZOCOR) 20 MG tablet Take 1 tablet (20 mg total) by mouth every evening. 90 tablet 4   No current facility-administered medications on file prior to visit.     Social History  Substance Use Topics  . Smoking status: Never Smoker  . Smokeless tobacco: Never Used  . Alcohol use No    Review of Systems  Constitutional: Negative for chills and fever.  Respiratory: Negative for cough and shortness of breath.   Cardiovascular: Negative for chest pain and palpitations.  Gastrointestinal: Negative for nausea and vomiting.  Skin: Positive for color change (bilateral calves have darkened area of skin for years; seen by hematologist).  Neurological: Negative for dizziness.  Hematological: Does not bruise/bleed easily.      Objective:    BP 116/72   Pulse 86   Temp 97.8 F (36.6 C) (Oral)   Wt 129 lb 12.8 oz (58.9 kg)   SpO2 98%  BP Readings from Last 3 Encounters:  01/18/16 116/72  12/13/15 138/70  Wt Readings from Last 3 Encounters:  01/18/16 129 lb 12.8 oz (58.9 kg)  12/13/15 130 lb 9.6 oz (59.2 kg)    Physical Exam  Constitutional: She appears well-developed and well-nourished.  Eyes: Conjunctivae are normal.  Cardiovascular: Normal rate, regular rhythm, normal heart sounds and normal pulses.   Pulmonary/Chest: Effort normal and breath sounds normal. She has no wheezes. She has no rhonchi. She has no rales.  Neurological: She is alert.  Skin: Skin is warm and dry.  Hyperpigmented areas ventral aspect of BLE. Nonblanchable, nontender.   Psychiatric: She has a normal mood and affect. Her speech is normal and behavior is normal. Thought content normal.  Vitals reviewed.        Assessment & Plan:   Problem List Items Addressed This Visit      Endocrine   Type 2 diabetes mellitus ([place])    Uncontrolled. Increased metformin. Referred to diabetic nutritionist and ordered diabetic supplies for patient to test blood sugar at home. Follow up in 3 months.       Relevant Medications   metFORMIN (GLUCOPHAGE) 1000 MG tablet   glucose blood test strip   Blood Glucose Monitoring Suppl (ONE TOUCH ULTRA MINI) w/Device KIT   ONE TOUCH LANCETS MISC   Other Relevant Orders   TSH   Ambulatory referral to diabetic education     Musculoskeletal and Integument   Hyperpigmentation of skin    Nonspecific at this time. Chronic. Lesion not c/w petechiae or ecchymosis. Will follow and advised patient to discuss with heme-onc as well.         Other   Hyperlipidemia    Compliant with medication. Will continue current regimen and recheck lipids at follow up.       Thalassemia minor    Stable. Advised to not take iron. No acute bleeding source identified. Hemoglobin appears at patient's baseline. Microcytic anemia supports thalassemia. Pending anemia studies. Advised patient to go back to hematology due to my concern for blood malignancies after learning the h/o of thrombocytopenia as well. I would like to ensure that patient has been appropriately worked up and followed by heme-onc. Daughter and patient agreed with this plan.        Other Visit Diagnoses    Anemia, unspecified anemia type    -  Primary   Relevant Orders   CBC with Differential/Platelet   Ferritin   IBC panel   B12 and Folate Panel   Intrinsic Factor Antibodies   Methylmalonic acid, serum   Homocysteine   Pathologist smear review       I have discontinued Ms. Blanchette's metFORMIN. I am also having her start on metFORMIN, glucose blood, ONE TOUCH ULTRA MINI, and ONE TOUCH LANCETS. Additionally, I am having her maintain her Calcium Carbonate-Vitamin D, Cyanocobalamin, omeprazole, CENTRUM SILVER ULTRA WOMENS,  Vitamin D3, alendronate, folic acid, diclofenac sodium, and simvastatin.   Meds ordered this encounter  Medications  . metFORMIN (GLUCOPHAGE) 1000 MG tablet    Sig: Take 1 tablet (1,000 mg total) by mouth 2 (two) times daily with a meal.    Dispense:  180 tablet    Refill:  3    Order Specific Question:   Supervising Provider    Answer:   Deborra Medina L [[id number]]  . glucose blood test strip    Sig: Use to test blood sugar twice daily.    Dispense:  100 each    Refill:  3    Order Specific Question:   Supervising Provider  Answer:   TULLO, TERESA L [[id number]]  . Blood Glucose Monitoring Suppl (ONE TOUCH ULTRA MINI) w/Device KIT    Sig: Use to test blood sugar twice daily.    Dispense:  1 each    Refill:  0    Order Specific Question:   Supervising Provider    Answer:   Deborra Medina L [[id number]]  . ONE TOUCH LANCETS MISC    Sig: Use to test blood sugar twice daily.    Dispense:  100 each    Refill:  3    Order Specific Question:   Supervising Provider    Answer:   Crecencio Mc [[id number]]    Return precautions given.   Risks, benefits, and alternatives of the medications and treatment plan prescribed today were discussed, and patient expressed understanding.   Education regarding symptom management and diagnosis given to patient on AVS.  Continue to follow with Mable Paris, FNP for routine health maintenance.   Tammy Sours and I agreed with plan.   Mable Paris, FNP  Total of 25 minutes spent with patient, greater than 50% of which was spent in discussion of  Of thalassemia minor(new diagnosis to me) , and adequacy of her past work up.

## 2016-01-18 NOTE — Assessment & Plan Note (Signed)
Compliant with medication. Will continue current regimen and recheck lipids at follow up.

## 2016-01-19 ENCOUNTER — Other Ambulatory Visit: Payer: Self-pay | Admitting: Family

## 2016-01-19 DIAGNOSIS — E039 Hypothyroidism, unspecified: Secondary | ICD-10-CM

## 2016-01-19 LAB — INTRINSIC FACTOR ANTIBODIES: INTRINSIC FACTOR: NEGATIVE

## 2016-01-19 LAB — PATHOLOGIST SMEAR REVIEW

## 2016-01-19 LAB — HOMOCYSTEINE: Homocysteine: 7.1 umol/L (ref <10.4)

## 2016-01-19 NOTE — Progress Notes (Signed)
Calculated 1.516mcg x 58.9 kg= 94.24 mcg.   Will check T4 and then start 75 mcg synthroid if t4 low.

## 2016-01-20 LAB — METHYLMALONIC ACID, SERUM: Methylmalonic Acid, Quant: 77 nmol/L — ABNORMAL LOW (ref 87–318)

## 2016-01-24 ENCOUNTER — Telehealth: Payer: Self-pay | Admitting: *Deleted

## 2016-01-24 NOTE — Telephone Encounter (Signed)
Patient was informed of results.  Patient understood and no questions, comments, or concerns at this time.  

## 2016-01-24 NOTE — Telephone Encounter (Signed)
Please advise, thanks.

## 2016-01-24 NOTE — Telephone Encounter (Signed)
Patient requested lab results  Pt contact [phone]

## 2016-01-27 ENCOUNTER — Other Ambulatory Visit (INDEPENDENT_AMBULATORY_CARE_PROVIDER_SITE_OTHER): Payer: BLUE CROSS/BLUE SHIELD

## 2016-01-27 DIAGNOSIS — E039 Hypothyroidism, unspecified: Secondary | ICD-10-CM | POA: Diagnosis not present

## 2016-01-27 LAB — T4, FREE: Free T4: 0.84 ng/dL (ref 0.60–1.60)

## 2016-01-30 ENCOUNTER — Other Ambulatory Visit: Payer: Self-pay | Admitting: Family

## 2016-01-30 DIAGNOSIS — E038 Other specified hypothyroidism: Secondary | ICD-10-CM

## 2016-01-30 DIAGNOSIS — E039 Hypothyroidism, unspecified: Secondary | ICD-10-CM

## 2016-02-01 ENCOUNTER — Telehealth: Payer: Self-pay

## 2016-02-01 LAB — THYROTROPIN RECEPTOR AUTOABS

## 2016-02-01 NOTE — Telephone Encounter (Signed)
[facility] was unable to perform Thyrotropin receptor test. Specimen was sent incorrectly.  Attempted to call patient. Left VM asking patient to call back and schedule lab visit to have lab re-drawn.

## 2016-02-20 ENCOUNTER — Ambulatory Visit: Payer: BLUE CROSS/BLUE SHIELD | Admitting: Family

## 2016-02-20 DIAGNOSIS — Z0289 Encounter for other administrative examinations: Secondary | ICD-10-CM

## 2016-02-21 ENCOUNTER — Encounter: Payer: Self-pay | Admitting: Family

## 2016-02-21 ENCOUNTER — Ambulatory Visit (INDEPENDENT_AMBULATORY_CARE_PROVIDER_SITE_OTHER): Payer: BLUE CROSS/BLUE SHIELD | Admitting: Family

## 2016-02-21 VITALS — BP 118/78 | HR 79 | Temp 98.1°F | Wt 128.3 lb

## 2016-02-21 DIAGNOSIS — E119 Type 2 diabetes mellitus without complications: Secondary | ICD-10-CM

## 2016-02-21 DIAGNOSIS — D563 Thalassemia minor: Secondary | ICD-10-CM

## 2016-02-21 DIAGNOSIS — E039 Hypothyroidism, unspecified: Secondary | ICD-10-CM | POA: Diagnosis not present

## 2016-02-21 DIAGNOSIS — K219 Gastro-esophageal reflux disease without esophagitis: Secondary | ICD-10-CM

## 2016-02-21 DIAGNOSIS — E038 Other specified hypothyroidism: Secondary | ICD-10-CM | POA: Insufficient documentation

## 2016-02-21 DIAGNOSIS — Z1231 Encounter for screening mammogram for malignant neoplasm of breast: Secondary | ICD-10-CM

## 2016-02-21 DIAGNOSIS — Z1239 Encounter for other screening for malignant neoplasm of breast: Secondary | ICD-10-CM

## 2016-02-21 LAB — HEMOGLOBIN A1C: Hgb A1c MFr Bld: 7.8 % — ABNORMAL HIGH (ref 4.6–6.5)

## 2016-02-21 MED ORDER — OMEPRAZOLE 20 MG PO CPDR: 20.0000 mg | DELAYED_RELEASE_CAPSULE | Freq: Every day | ORAL | 3 refills | Status: DC

## 2016-02-21 NOTE — Patient Instructions (Signed)
Mammogram  Enjoy [place]!

## 2016-02-21 NOTE — Assessment & Plan Note (Signed)
Pending A1c. Compliant with medication.

## 2016-02-21 NOTE — Assessment & Plan Note (Signed)
Asymptomatic. Will continue to follow and redraw TSH, T4 when patient returns from [place] in December.

## 2016-02-21 NOTE — Assessment & Plan Note (Signed)
Symptomatically stable. Slight improvement in hemoglobin from prior. Patient continues to take B12, folate and daughter states that when she returns from [place] in December that she will follow-up with hematology as I requested.

## 2016-02-21 NOTE — Assessment & Plan Note (Signed)
Stable. No red flag symptoms. Refill omeprazole 20 mg daily. Encouraged patient to avoid spicy, sour foods and to be sure that she is not laying down after large meals. Per daughter, patient has had a recent EGD and is UTD on colonoscopy both which were normal. I do not have records of this and have requested records again from daughter.

## 2016-02-21 NOTE — Progress Notes (Signed)
Pre visit review using our clinic review tool, if applicable. No additional management support is needed unless otherwise documented below in the visit note. 

## 2016-02-21 NOTE — Progress Notes (Signed)
Subjective:    Patient ID: Janet Moyer, female    DOB: 07-Oct-1950, 65 y.o.   MRN: [id number]  CC: Janet Moyer is a 65 y.o. female who presents today for follow up.   HPI: She is here for follow up and would like refill for acid reflux medication.   GERD- had been following with GI however hasn't seen in one year. Endorses epigastric burning which responds well to omeprazole. Associated with spicy and sour foods. No hoarseness, difficulty swallowing, unintentional weightloss. Daughter states EGD normal 2 years ago. Colonoscopy 2009 normal. Will repeat in 10 years.   Anemia- No SOB, dizziness. Plans to see heme after trip to [place].   DM- Following DM diet.   Hypothyroidism subclinical- No palpitations, hot/cold intolerance.     HISTORY:  Past Medical History:  Diagnosis Date  . Arthritis   . Depression   . Diabetes mellitus without complication ([place])   . GERD (gastroesophageal reflux disease)   . Hyperlipidemia   . Hyperlipidemia   . Thyroid disorder   . UTI (lower urinary tract infection)    Past Surgical History:  Procedure Laterality Date  . CESAREAN SECTION    . ESOPHAGOGASTRODUODENOSCOPY     Family History  Problem Relation Age of Onset  . Hyperlipidemia Mother   . Hypertension Mother   . Diabetes Mother   . Hyperlipidemia Father   . Stroke Father   . Hypertension Father   . Diabetes Father     Allergies: Review of patient's allergies indicates not on file. Current Outpatient Prescriptions on File Prior to Visit  Medication Sig Dispense Refill  . Blood Glucose Monitoring Suppl (ONE TOUCH ULTRA MINI) w/Device KIT Use to test blood sugar twice daily. 1 each 0  . Calcium Carbonate-Vitamin D 600-400 MG-UNIT tablet Take by mouth.    . Cholecalciferol (VITAMIN D3) 1000 units CAPS Take by mouth.    . Cyanocobalamin (RA VITAMIN B-12 TR) 1000 MCG TBCR Take by mouth.    . diclofenac sodium (VOLTAREN) 1 % GEL Apply 4 g topically 4 (four) times daily. 1 Tube 3  .  folic acid (FOLVITE) 269 MCG tablet Take 400 mcg by mouth daily.    Marland Kitchen glucose blood test strip Use to test blood sugar twice daily. 100 each 3  . metFORMIN (GLUCOPHAGE) 1000 MG tablet Take 1 tablet (1,000 mg total) by mouth 2 (two) times daily with a meal. 180 tablet 3  . Multiple Vitamins-Minerals (CENTRUM SILVER ULTRA WOMENS) TABS Take by mouth.    . ONE TOUCH LANCETS MISC Use to test blood sugar twice daily. 100 each 3  . simvastatin (ZOCOR) 20 MG tablet Take 1 tablet (20 mg total) by mouth every evening. 90 tablet 4   No current facility-administered medications on file prior to visit.     Social History  Substance Use Topics  . Smoking status: Never Smoker  . Smokeless tobacco: Never Used  . Alcohol use No    Review of Systems  Constitutional: Negative for chills, fever and unexpected weight change.  HENT: Negative for trouble swallowing and voice change.   Respiratory: Negative for cough.   Cardiovascular: Negative for chest pain and palpitations.  Gastrointestinal: Negative for nausea and vomiting.  Endocrine: Negative for cold intolerance and heat intolerance.  Neurological: Negative for dizziness.      Objective:    BP 118/78   Pulse 79   Temp 98.1 F (36.7 C) (Oral)   Wt 128 lb 4.8 oz (58.2 kg)  SpO2 98%  BP Readings from Last 3 Encounters:  02/21/16 118/78  01/18/16 116/72  12/13/15 138/70   Wt Readings from Last 3 Encounters:  02/21/16 128 lb 4.8 oz (58.2 kg)  01/18/16 129 lb 12.8 oz (58.9 kg)  12/13/15 130 lb 9.6 oz (59.2 kg)    Physical Exam  Constitutional: She appears well-developed and well-nourished.  Eyes: Conjunctivae are normal.  Cardiovascular: Normal rate, regular rhythm, normal heart sounds and normal pulses.   Pulmonary/Chest: Effort normal and breath sounds normal. She has no wheezes. She has no rhonchi. She has no rales.  Neurological: She is alert.  Skin: Skin is warm and dry.  Psychiatric: She has a normal mood and affect. Her speech  is normal and behavior is normal. Thought content normal.  Vitals reviewed.      Assessment & Plan:   Problem List Items Addressed This Visit      Digestive   GERD (gastroesophageal reflux disease) - Primary    Stable. No red flag symptoms. Refill omeprazole 20 mg daily. Encouraged patient to avoid spicy, sour foods and to be sure that she is not laying down after large meals. Per daughter, patient has had a recent EGD and is UTD on colonoscopy both which were normal. I do not have records of this and have requested records again from daughter.      Relevant Medications   omeprazole (PRILOSEC) 20 MG capsule     Endocrine   Type 2 diabetes mellitus ([place])    Pending A1c. Compliant with medication.       Relevant Orders   Hemoglobin A1c   Subclinical hypothyroidism    Asymptomatic. Will continue to follow and redraw TSH, T4 when patient returns from [place] in December.         Other   Thalassemia minor    Symptomatically stable. Slight improvement in hemoglobin from prior. Patient continues to take B12, folate and daughter states that when she returns from [place] in December that she will follow-up with hematology as I requested.        Other Visit Diagnoses    Screening for breast cancer       Relevant Orders   MM DIGITAL SCREENING BILATERAL       I have changed Ms. Derner's omeprazole. I am also having her maintain her Calcium Carbonate-Vitamin D, Cyanocobalamin, CENTRUM SILVER ULTRA WOMENS, Vitamin D3, folic acid, diclofenac sodium, simvastatin, metFORMIN, glucose blood, ONE TOUCH ULTRA MINI, ONE TOUCH LANCETS, and alendronate.   Meds ordered this encounter  Medications  . omeprazole (PRILOSEC) 20 MG capsule    Sig: Take 1 capsule (20 mg total) by mouth daily.    Dispense:  90 capsule    Refill:  3    Order Specific Question:   Supervising Provider    Answer:   Deborra Medina L [[id number]]  . alendronate (FOSAMAX) 10 MG tablet    Sig: Take 10 mg by mouth daily before  breakfast. Take with a full glass of water on an empty stomach.    Return precautions given.   Risks, benefits, and alternatives of the medications and treatment plan prescribed today were discussed, and patient expressed understanding.   Education regarding symptom management and diagnosis given to patient on AVS.  Continue to follow with Mable Paris, FNP for routine health maintenance.   Tammy Sours and I agreed with plan.   Mable Paris, FNP

## 2016-02-22 ENCOUNTER — Encounter: Payer: Self-pay | Admitting: Family

## 2016-03-27 ENCOUNTER — Ambulatory Visit: Payer: BLUE CROSS/BLUE SHIELD

## 2016-04-25 ENCOUNTER — Ambulatory Visit: Payer: BLUE CROSS/BLUE SHIELD

## 2017-01-21 ENCOUNTER — Telehealth: Payer: Self-pay | Admitting: Family

## 2017-01-21 NOTE — Telephone Encounter (Signed)
Called patient to schedule [facility] FU for BP and one year FU with PCP, patient stated she does not drive that her brother will have to call and setup appointment , please schedule BP Follow up for [facility] when patient call.

## 2017-07-09 ENCOUNTER — Telehealth: Payer: Self-pay | Admitting: Family

## 2017-07-09 NOTE — Telephone Encounter (Signed)
Please mail letter-   Hope you are well.   In reviewing your chart, it appears your are due for annual mammogram.  Please let us know if you would like for me to order. You may call the office to let us know, and we will order for you.   Once the order in the system, you may schedule at your preferred location.   Typically women have been using one of the sites below however you may go where you have been in the past. I would ensure that when you do get a mammogram that it is 3D ( as opposed to 2D which was prior technology). Evidence suggests that 3D is superior.   Please note that NOT all insurance companies cover 3D, and you may have to pay a higher copay. You may call your insurance company to further clarify your benefits.   Options for Mammogram in [place]:    [facility]  [address]  [place], [place]  [phone]   [facility] [address] [place], [place] [phone]   Let us know if you have questions.   My best,   Margaret Arnett, NP   

## 2017-07-18 ENCOUNTER — Encounter: Payer: Self-pay | Admitting: *Deleted

## 2017-07-18 NOTE — Telephone Encounter (Signed)
Letter mailed

## 2017-07-19 ENCOUNTER — Encounter: Payer: Self-pay | Admitting: Family

## 2017-09-05 ENCOUNTER — Other Ambulatory Visit: Payer: Self-pay | Admitting: Family Medicine

## 2017-09-05 DIAGNOSIS — Z1231 Encounter for screening mammogram for malignant neoplasm of breast: Secondary | ICD-10-CM

## 2017-09-05 DIAGNOSIS — Z780 Asymptomatic menopausal state: Secondary | ICD-10-CM

## 2017-09-12 ENCOUNTER — Ambulatory Visit
Admission: RE | Admit: 2017-09-12 | Discharge: 2017-09-12 | Disposition: A | Discharge status: Home / Self Care | Payer: Medicare Other | Source: Ambulatory Visit | Attending: Family Medicine | Admitting: Family Medicine

## 2017-09-12 ENCOUNTER — Ambulatory Visit
Admission: EM | Admit: 2017-09-12 | Discharge: 2017-09-12 | Discharge status: Absent without leave | Payer: BLUE CROSS/BLUE SHIELD

## 2017-09-12 ENCOUNTER — Other Ambulatory Visit: Payer: Self-pay | Admitting: Family Medicine

## 2017-09-12 DIAGNOSIS — M25561 Pain in right knee: Secondary | ICD-10-CM | POA: Diagnosis present

## 2017-09-12 DIAGNOSIS — R52 Pain, unspecified: Secondary | ICD-10-CM

## 2017-09-12 DIAGNOSIS — M1711 Unilateral primary osteoarthritis, right knee: Secondary | ICD-10-CM | POA: Diagnosis not present

## 2017-09-12 DIAGNOSIS — G8929 Other chronic pain: Secondary | ICD-10-CM | POA: Insufficient documentation

## 2017-10-01 ENCOUNTER — Ambulatory Visit
Admission: RE | Admit: 2017-10-01 | Discharge: 2017-10-01 | Disposition: A | Discharge status: Home / Self Care | Payer: Medicare Other | Source: Ambulatory Visit | Attending: Family Medicine | Admitting: Family Medicine

## 2017-10-01 DIAGNOSIS — Z780 Asymptomatic menopausal state: Secondary | ICD-10-CM

## 2017-10-01 DIAGNOSIS — Z1231 Encounter for screening mammogram for malignant neoplasm of breast: Secondary | ICD-10-CM | POA: Insufficient documentation

## 2019-01-14 ENCOUNTER — Other Ambulatory Visit: Payer: Self-pay | Admitting: Family Medicine

## 2019-01-14 DIAGNOSIS — Z1231 Encounter for screening mammogram for malignant neoplasm of breast: Secondary | ICD-10-CM

## 2019-01-19 ENCOUNTER — Other Ambulatory Visit: Payer: Self-pay | Admitting: Dermatology

## 2019-01-19 ENCOUNTER — Other Ambulatory Visit: Payer: Self-pay

## 2019-01-19 ENCOUNTER — Ambulatory Visit
Admission: RE | Admit: 2019-01-19 | Discharge: 2019-01-19 | Disposition: A | Discharge status: Home / Self Care | Payer: Medicare Other | Source: Ambulatory Visit | Attending: *Deleted | Admitting: *Deleted

## 2019-01-19 ENCOUNTER — Other Ambulatory Visit: Payer: Self-pay | Admitting: Rheumatology

## 2019-01-21 ENCOUNTER — Other Ambulatory Visit: Payer: Self-pay

## 2019-01-21 ENCOUNTER — Ambulatory Visit
Admission: RE | Admit: 2019-01-21 | Discharge: 2019-01-21 | Disposition: A | Discharge status: Home / Self Care | Payer: Medicare Other | Source: Ambulatory Visit | Attending: Dermatology | Admitting: Dermatology

## 2019-01-21 ENCOUNTER — Ambulatory Visit
Admission: RE | Admit: 2019-01-21 | Discharge: 2019-01-21 | Disposition: A | Discharge status: Home / Self Care | Payer: Medicare Other | Attending: Dermatology | Admitting: Dermatology

## 2019-01-21 ENCOUNTER — Other Ambulatory Visit: Payer: Self-pay | Admitting: Dermatology

## 2019-01-21 DIAGNOSIS — L989 Disorder of the skin and subcutaneous tissue, unspecified: Secondary | ICD-10-CM | POA: Insufficient documentation

## 2019-02-25 ENCOUNTER — Ambulatory Visit
Admission: RE | Admit: 2019-02-25 | Discharge: 2019-02-25 | Disposition: A | Discharge status: Home / Self Care | Payer: Medicare Other | Source: Ambulatory Visit | Attending: Family Medicine | Admitting: Family Medicine

## 2019-02-25 DIAGNOSIS — Z1231 Encounter for screening mammogram for malignant neoplasm of breast: Secondary | ICD-10-CM | POA: Insufficient documentation

## 2019-03-11 ENCOUNTER — Other Ambulatory Visit: Payer: Self-pay | Admitting: Family Medicine

## 2019-03-11 DIAGNOSIS — Z780 Asymptomatic menopausal state: Secondary | ICD-10-CM

## 2019-08-19 ENCOUNTER — Emergency Department: Payer: Medicare Other

## 2019-08-19 ENCOUNTER — Other Ambulatory Visit: Payer: Self-pay

## 2019-08-19 ENCOUNTER — Inpatient Hospital Stay
Admission: EM | Admit: 2019-08-19 | Discharge: 2019-08-21 | DRG: 389 | Disposition: A | Discharge status: Home / Self Care | Payer: Medicare Other | Attending: General Surgery | Admitting: General Surgery

## 2019-08-19 ENCOUNTER — Encounter: Payer: Self-pay | Admitting: Emergency Medicine

## 2019-08-19 DIAGNOSIS — K567 Ileus, unspecified: Secondary | ICD-10-CM | POA: Diagnosis not present

## 2019-08-19 DIAGNOSIS — R112 Nausea with vomiting, unspecified: Secondary | ICD-10-CM

## 2019-08-19 DIAGNOSIS — K5651 Intestinal adhesions [bands], with partial obstruction: Secondary | ICD-10-CM | POA: Diagnosis present

## 2019-08-19 DIAGNOSIS — M810 Age-related osteoporosis without current pathological fracture: Secondary | ICD-10-CM | POA: Diagnosis present

## 2019-08-19 DIAGNOSIS — Z823 Family history of stroke: Secondary | ICD-10-CM

## 2019-08-19 DIAGNOSIS — Z7984 Long term (current) use of oral hypoglycemic drugs: Secondary | ICD-10-CM

## 2019-08-19 DIAGNOSIS — Z20822 Contact with and (suspected) exposure to covid-19: Secondary | ICD-10-CM | POA: Diagnosis present

## 2019-08-19 DIAGNOSIS — E860 Dehydration: Secondary | ICD-10-CM | POA: Diagnosis present

## 2019-08-19 DIAGNOSIS — Z79899 Other long term (current) drug therapy: Secondary | ICD-10-CM

## 2019-08-19 DIAGNOSIS — E872 Acidosis: Secondary | ICD-10-CM | POA: Diagnosis present

## 2019-08-19 DIAGNOSIS — K219 Gastro-esophageal reflux disease without esophagitis: Secondary | ICD-10-CM | POA: Diagnosis present

## 2019-08-19 DIAGNOSIS — K56600 Partial intestinal obstruction, unspecified as to cause: Secondary | ICD-10-CM

## 2019-08-19 DIAGNOSIS — E119 Type 2 diabetes mellitus without complications: Secondary | ICD-10-CM | POA: Diagnosis present

## 2019-08-19 DIAGNOSIS — Z8249 Family history of ischemic heart disease and other diseases of the circulatory system: Secondary | ICD-10-CM

## 2019-08-19 DIAGNOSIS — Z7983 Long term (current) use of bisphosphonates: Secondary | ICD-10-CM

## 2019-08-19 DIAGNOSIS — M1990 Unspecified osteoarthritis, unspecified site: Secondary | ICD-10-CM | POA: Diagnosis present

## 2019-08-19 DIAGNOSIS — K529 Noninfective gastroenteritis and colitis, unspecified: Secondary | ICD-10-CM | POA: Diagnosis present

## 2019-08-19 DIAGNOSIS — Z833 Family history of diabetes mellitus: Secondary | ICD-10-CM

## 2019-08-19 DIAGNOSIS — I10 Essential (primary) hypertension: Secondary | ICD-10-CM | POA: Diagnosis present

## 2019-08-19 DIAGNOSIS — Z83438 Family history of other disorder of lipoprotein metabolism and other lipidemia: Secondary | ICD-10-CM

## 2019-08-19 DIAGNOSIS — E785 Hyperlipidemia, unspecified: Secondary | ICD-10-CM | POA: Diagnosis present

## 2019-08-19 DIAGNOSIS — E02 Subclinical iodine-deficiency hypothyroidism: Secondary | ICD-10-CM | POA: Diagnosis present

## 2019-08-19 LAB — CBC
HCT: 35.9 % — ABNORMAL LOW (ref 36.0–46.0)
Hemoglobin: 11.4 g/dL — ABNORMAL LOW (ref 12.0–15.0)
MCH: 21.4 pg — ABNORMAL LOW (ref 26.0–34.0)
MCHC: 31.8 g/dL (ref 30.0–36.0)
MCV: 67.4 fL — ABNORMAL LOW (ref 80.0–100.0)
Platelets: 318 10*3/uL (ref 150–400)
RBC: 5.33 MIL/uL — ABNORMAL HIGH (ref 3.87–5.11)
RDW: 14.9 % (ref 11.5–15.5)
WBC: 14.4 10*3/uL — ABNORMAL HIGH (ref 4.0–10.5)
nRBC: 0 % (ref 0.0–0.2)

## 2019-08-19 LAB — COMPREHENSIVE METABOLIC PANEL
ALT: 17 U/L (ref 0–44)
AST: 20 U/L (ref 15–41)
Albumin: 4.4 g/dL (ref 3.5–5.0)
Alkaline Phosphatase: 67 U/L (ref 38–126)
Anion gap: 12 (ref 5–15)
BUN: 22 mg/dL (ref 8–23)
CO2: 24 mmol/L (ref 22–32)
Calcium: 9.4 mg/dL (ref 8.9–10.3)
Chloride: 99 mmol/L (ref 98–111)
Creatinine, Ser: 0.67 mg/dL (ref 0.44–1.00)
GFR calc Af Amer: >60 mL/min (ref >60)
GFR calc non Af Amer: >60 mL/min (ref >60)
Glucose, Bld: 204 mg/dL — ABNORMAL HIGH (ref 70–99)
Potassium: 4 mmol/L (ref 3.5–5.1)
Sodium: 135 mmol/L (ref 135–145)
Total Bilirubin: 1 mg/dL (ref 0.3–1.2)
Total Protein: 7.8 g/dL (ref 6.5–8.1)

## 2019-08-19 LAB — URINALYSIS, COMPLETE (UACMP) WITH MICROSCOPIC
Bacteria, UA: NONE SEEN
Bilirubin Urine: NEGATIVE
Glucose, UA: NEGATIVE mg/dL
Hgb urine dipstick: NEGATIVE
Ketones, ur: 20 mg/dL — AB
Leukocytes,Ua: NEGATIVE
Nitrite: NEGATIVE
Protein, ur: NEGATIVE mg/dL
Specific Gravity, Urine: >1.046 — ABNORMAL HIGH (ref 1.005–1.030)
Squamous Epithelial / LPF: NONE SEEN (ref 0–5)
pH: 8 (ref 5.0–8.0)

## 2019-08-19 LAB — HIV ANTIBODY (ROUTINE TESTING W REFLEX): HIV Screen 4th Generation wRfx: NONREACTIVE

## 2019-08-19 LAB — RESPIRATORY PANEL BY RT PCR (FLU A&B, COVID)
Influenza A by PCR: NEGATIVE
Influenza B by PCR: NEGATIVE
SARS Coronavirus 2 by RT PCR: NEGATIVE

## 2019-08-19 LAB — LACTIC ACID, PLASMA
Lactic Acid, Venous: 2.4 mmol/L (ref 0.5–1.9)
Lactic Acid, Venous: 1.8 mmol/L (ref 0.5–1.9)

## 2019-08-19 LAB — LIPASE, BLOOD: Lipase: 39 U/L (ref 11–51)

## 2019-08-19 MED ORDER — MORPHINE SULFATE (PF) 2 MG/ML IV SOLN
2.0000 mg | INTRAVENOUS | Status: DC | PRN
  Administered 2019-08-20: 08:00:00 4 mg via INTRAVENOUS
  Filled 2019-08-19: qty 2

## 2019-08-19 MED ORDER — ENOXAPARIN SODIUM 40 MG/0.4ML ~~LOC~~ SOLN
40.0000 mg | SUBCUTANEOUS | Status: DC
  Administered 2019-08-19 – 2019-08-20 (×2): 40 mg via SUBCUTANEOUS
  Filled 2019-08-19 (×2): qty 0.4

## 2019-08-19 MED ORDER — FENTANYL CITRATE (PF) 100 MCG/2ML IJ SOLN
50.0000 ug | INTRAMUSCULAR | Status: DC | PRN
  Administered 2019-08-19: 50 ug via INTRAVENOUS

## 2019-08-19 MED ORDER — ONDANSETRON HCL 4 MG/2ML IJ SOLN
INTRAMUSCULAR | Status: AC
  Filled 2019-08-19: qty 2

## 2019-08-19 MED ORDER — ONDANSETRON HCL 4 MG/2ML IJ SOLN: 4.0000 mg | Freq: Four times a day (QID) | INTRAMUSCULAR | Status: DC | PRN

## 2019-08-19 MED ORDER — MORPHINE SULFATE (PF) 4 MG/ML IV SOLN
4.0000 mg | Freq: Once | INTRAVENOUS | Status: AC
  Administered 2019-08-19: 4 mg via INTRAVENOUS
  Filled 2019-08-19: qty 1

## 2019-08-19 MED ORDER — SIMVASTATIN 10 MG PO TABS
10.0000 mg | ORAL_TABLET | Freq: Every day | ORAL | Status: DC
  Administered 2019-08-19 – 2019-08-20 (×2): 10 mg via ORAL
  Filled 2019-08-19 (×3): qty 1

## 2019-08-19 MED ORDER — ALENDRONATE SODIUM 10 MG PO TABS: 10.0000 mg | ORAL_TABLET | Freq: Every day | ORAL | Status: DC

## 2019-08-19 MED ORDER — ACETAMINOPHEN 500 MG PO TABS: 1000.0000 mg | ORAL_TABLET | Freq: Four times a day (QID) | ORAL | Status: DC | PRN

## 2019-08-19 MED ORDER — SODIUM CHLORIDE 0.9% FLUSH: 3.0000 mL | Freq: Once | INTRAVENOUS | Status: DC

## 2019-08-19 MED ORDER — ONDANSETRON 4 MG PO TBDP: 4.0000 mg | ORAL_TABLET | Freq: Four times a day (QID) | ORAL | Status: DC | PRN

## 2019-08-19 MED ORDER — IOHEXOL 300 MG/ML  SOLN
100.0000 mL | Freq: Once | INTRAMUSCULAR | Status: AC | PRN
Start: 2019-08-19 — End: 2019-08-19
  Administered 2019-08-19: 75 mL via INTRAVENOUS

## 2019-08-19 MED ORDER — FENTANYL CITRATE (PF) 100 MCG/2ML IJ SOLN
INTRAMUSCULAR | Status: AC
  Filled 2019-08-19: qty 2

## 2019-08-19 MED ORDER — LACTATED RINGERS IV SOLN: INTRAVENOUS | Status: DC

## 2019-08-19 MED ORDER — LACTATED RINGERS IV BOLUS
1000.0000 mL | Freq: Once | INTRAVENOUS | Status: AC
  Administered 2019-08-19: 1000 mL via INTRAVENOUS

## 2019-08-19 MED ORDER — ONDANSETRON HCL 4 MG/2ML IJ SOLN
4.0000 mg | Freq: Once | INTRAMUSCULAR | Status: AC
  Administered 2019-08-19: 4 mg via INTRAVENOUS

## 2019-08-19 MED ORDER — FOLIC ACID 1 MG PO TABS
500.0000 ug | ORAL_TABLET | Freq: Every day | ORAL | Status: DC
  Administered 2019-08-19 – 2019-08-21 (×3): 0.5 mg via ORAL
  Filled 2019-08-19 (×3): qty 1

## 2019-08-19 NOTE — ED Provider Notes (Signed)
[facility] Emergency Department Provider Note   ____________________________________________   First MD Initiated Contact with Patient 08/19/19 [phone]     (approximate)  I have reviewed the triage vital signs and the nursing notes.   HISTORY  Chief Complaint Abdominal Pain and Emesis    HPI Janet Moyer is a 69 y.o. female with past medical history of hypertension, hyperlipidemia, diabetes, and GERD who presents to the ED complaining of abdominal pain.  19 of history is obtained from daughter, who states that patient had acute onset of severe abdominal pain around 2 AM this morning.  Pain is described as sharp and primarily localized around her umbilicus.  Since onset, the pain has been constant and is not exacerbated or alleviated by anything in particular.  She has had multiple episodes of nonbilious and nonbloody emesis, has continued to pass stool but has not had any diarrhea.  She was feeling well prior to this morning with no recent fevers, cough, chest pain, or shortness of breath.  She denies any dysuria or hematuria.  She has a history of GERD, but has never had any surgeries on her abdomen.        Past Medical History:  Diagnosis Date  . Arthritis   . Depression   . Diabetes mellitus without complication ([place])   . GERD (gastroesophageal reflux disease)   . Hyperlipidemia   . Hyperlipidemia   . Thyroid disorder   . UTI (lower urinary tract infection)     Patient Active Problem List   Diagnosis Date Noted  . Subclinical hypothyroidism 02/21/2016  . Thalassemia minor 01/18/2016  . Hyperpigmentation of skin 01/18/2016  . Arthritis 12/13/2015  . Routine physical examination 12/13/2015  . Type 2 diabetes mellitus ([place]) 12/13/2015  . Osteoporosis 12/13/2015  . Hyperlipidemia 12/13/2015  . GERD (gastroesophageal reflux disease) 12/13/2015    Past Surgical History:  Procedure Laterality Date  . CESAREAN SECTION    .  ESOPHAGOGASTRODUODENOSCOPY      Prior to Admission medications   Medication Sig Start Date End Date Taking? Authorizing Provider  alendronate (FOSAMAX) 10 MG tablet Take 10 mg by mouth daily before breakfast. Take with a full glass of water on an empty stomach.    [provider]  Blood Glucose Monitoring Suppl (ONE TOUCH ULTRA MINI) w/Device KIT Use to test blood sugar twice daily. 01/18/16   Burnard Hawthorne, FNP  Calcium Carbonate-Vitamin D 600-400 MG-UNIT tablet Take by mouth.    [provider]  Cholecalciferol (VITAMIN D3) 1000 units CAPS Take by mouth.    [provider]  Cyanocobalamin (RA VITAMIN B-12 TR) 1000 MCG TBCR Take by mouth.    [provider]  diclofenac sodium (VOLTAREN) 1 % GEL Apply 4 g topically 4 (four) times daily. 12/13/15   Burnard Hawthorne, FNP  folic acid (FOLVITE) 846 MCG tablet Take 400 mcg by mouth daily.    [provider]  glucose blood test strip Use to test blood sugar twice daily. 01/18/16   Burnard Hawthorne, FNP  metFORMIN (GLUCOPHAGE) 1000 MG tablet Take 1 tablet (1,000 mg total) by mouth 2 (two) times daily with a meal. 01/18/16   Arnett, Yvetta Coder, FNP  Multiple Vitamins-Minerals (CENTRUM SILVER ULTRA WOMENS) TABS Take by mouth.    [provider]  omeprazole (PRILOSEC) 20 MG capsule Take 1 capsule (20 mg total) by mouth daily. 02/21/16 02/20/17  Burnard Hawthorne, FNP  ONE TOUCH LANCETS MISC Use to test blood sugar  twice daily. 01/18/16   Burnard Hawthorne, FNP  simvastatin (ZOCOR) 10 MG tablet Take 10 mg by mouth at bedtime. 05/30/19   [provider]    Allergies Patient has no known allergies.  Family History  Problem Relation Age of Onset  . Hyperlipidemia Mother   . Hypertension Mother   . Diabetes Mother   . Hyperlipidemia Father   . Stroke Father   . Hypertension Father   . Diabetes Father   . Breast cancer Neg Hx     Social History Social History   Tobacco Use  .  Smoking status: Never Smoker  . Smokeless tobacco: Never Used  Substance Use Topics  . Alcohol use: No  . Drug use: No    Review of Systems  Constitutional: No fever/chills Eyes: No visual changes. ENT: No sore throat. Cardiovascular: Denies chest pain. Respiratory: Denies shortness of breath. Gastrointestinal: Positive for abdominal pain, nausea, and vomiting.  No diarrhea.  No constipation. Genitourinary: Negative for dysuria. Musculoskeletal: Negative for back pain. Skin: Negative for rash. Neurological: Negative for headaches, focal weakness or numbness.  ____________________________________________   PHYSICAL EXAM:  VITAL SIGNS: ED Triage Vitals  Enc Vitals Group     BP 08/19/19 0731 135/72     Pulse Rate 08/19/19 0731 72     Resp 08/19/19 0731 20     Temp 08/19/19 0731 97.7 F (36.5 C)     Temp Source 08/19/19 0731 Oral     SpO2 08/19/19 0731 100 %     Weight 08/19/19 0726 128 lb 4.9 oz (58.2 kg)     Height --      Head Circumference --      Peak Flow --      Pain Score 08/19/19 0726 10     Pain Loc --      Pain Edu? --      Excl. in [place]? --     Constitutional: Alert and oriented. Eyes: Conjunctivae are normal. Head: Atraumatic. Nose: No congestion/rhinnorhea. Mouth/Throat: Mucous membranes are moist. Neck: Normal ROM Cardiovascular: Normal rate, regular rhythm. Grossly normal heart sounds. Respiratory: Normal respiratory effort.  No retractions. Lungs CTAB. Gastrointestinal: Soft and diffusely tender to palpation with no rebound or guarding. No distention. Genitourinary: deferred Musculoskeletal: No lower extremity tenderness nor edema. Neurologic:  Normal speech and language. No gross focal neurologic deficits are appreciated. Skin:  Skin is warm, dry and intact. No rash noted. Psychiatric: Mood and affect are normal. Speech and behavior are normal.  ____________________________________________   LABS (all labs ordered are listed, but only  abnormal results are displayed)  Labs Reviewed  COMPREHENSIVE METABOLIC PANEL - Abnormal; Notable for the following components:      Result Value   Glucose, Bld 204 (*)    All other components within normal limits  CBC - Abnormal; Notable for the following components:   WBC 14.4 (*)    RBC 5.33 (*)    Hemoglobin 11.4 (*)    HCT 35.9 (*)    MCV 67.4 (*)    MCH 21.4 (*)    All other components within normal limits  LACTIC ACID, PLASMA - Abnormal; Notable for the following components:   Lactic Acid, Venous 2.4 (*)    All other components within normal limits  RESPIRATORY PANEL BY RT PCR (FLU A&B, COVID)  LIPASE, BLOOD  URINALYSIS, COMPLETE (UACMP) WITH MICROSCOPIC  LACTIC ACID, PLASMA   ____________________________________________  EKG  ED ECG REPORT I, Blake Divine, the attending physician, personally viewed  and interpreted this ECG.   Date: 08/19/2019  EKG Time: 8:59  Rate: 71  Rhythm: normal sinus rhythm  Axis: Normal  Intervals:nonspecific intraventricular conduction delay  ST&T Change: None   PROCEDURES  Procedure(s) performed (including Critical Care):  Procedures   ____________________________________________   INITIAL IMPRESSION / ASSESSMENT AND PLAN / ED COURSE       69 year old female presents to the ED complaining of acute onset severe abdominal pain around 2 AM this morning with multiple episodes of vomiting.  She has diffuse tenderness on exam and continues to be uncomfortable despite dose of IV fentanyl and Zofran.  We will further assess with CT scan and labs.  Differential includes diverticulitis, appendicitis, PUD, gastritis, pancreatitis, hepatitis, cholecystitis, biliary colic, UTI.  Patient does not have any prior surgical history.  Low suspicion for cardiac etiology, but will screen EKG.  EKG without evidence of arrhythmia or ischemia.  Lab work remarkable only for mild leukocytosis.  CT scan shows evidence of bowel obstruction with  questionable internal hernia, as such surgery was consulted.  They recommend admission to surgical service for observation and continued fluid resuscitation given mild lactic acid elevation, but do not suspect significant internal hernia or gut ischemia.  Patient's pain remains much improved.      ____________________________________________   FINAL CLINICAL IMPRESSION(S) / ED DIAGNOSES  Final diagnoses:  Partial intestinal obstruction, unspecified cause ([place])  Non-intractable vomiting with nausea, unspecified vomiting type     ED Discharge Orders    None       Note:  This document was prepared using Dragon voice recognition software and may include unintentional dictation errors.   Blake Divine, MD 08/19/19 1128

## 2019-08-19 NOTE — ED Notes (Signed)
Introduced myself to pt and daughter, unhooked pt to use the bathroom. Provided pt w/ TV remote and mouth swabs.

## 2019-08-19 NOTE — Consult Note (Deleted)
HISTORY OF PRESENT ILLNESS (HPI):  69 y.o. female presented to [facility] ED today for evaluation of abdominal pain. Patient reportedly woke up around 2 AM this morning and noticed the acute onset of sharp and crampy umbilical abdominal pain. Accompanying this she reports multiple episodes of nausea and non-bloody, non-bilious emesis. She reports the belching temporarily relieves her discomfort but otherwise the pain has been constant since the onset. No fever, chills, cough, SOB, or CP. She has continued to have normal bowel movements even this morning but denied any flatus. Daughter states she had a similar episode in the past but this resolved spontaneously at home. Only previous abdominal surgery is C-section. Work up in the ED revealed slight leukocytosis to 14K, renal function was within normal limits, no electrolyte derangement, lipase was normal, lactic acid was 2.4 but suspect some under resuscitation from GI losses, and CT Abdomen/pelvis was concerning for dilated loops of small bowel consistent with obstruction.    Surgery is consulted by emergency medicine physician Dr. Blake Divine, MD in this context for evaluation and management of possible SBO.   PAST MEDICAL HISTORY (PMH):  Past Medical History:  Diagnosis Date  . Arthritis   . Depression   . Diabetes mellitus without complication ([place])   . GERD (gastroesophageal reflux disease)   . Hyperlipidemia   . Hyperlipidemia   . Thyroid disorder   . UTI (lower urinary tract infection)      PAST SURGICAL HISTORY ([place]):  Past Surgical History:  Procedure Laterality Date  . CESAREAN SECTION    . ESOPHAGOGASTRODUODENOSCOPY       MEDICATIONS:  Prior to Admission medications   Medication Sig Start Date End Date Taking? Authorizing Provider  alendronate (FOSAMAX) 10 MG tablet Take 10 mg by mouth daily before breakfast. Take with a full glass of water on an empty stomach.    [provider]  Blood Glucose Monitoring Suppl (ONE  TOUCH ULTRA MINI) w/Device KIT Use to test blood sugar twice daily. 01/18/16   Burnard Hawthorne, FNP  Calcium Carbonate-Vitamin D 600-400 MG-UNIT tablet Take by mouth.    [provider]  Cholecalciferol (VITAMIN D3) 1000 units CAPS Take by mouth.    [provider]  Cyanocobalamin (RA VITAMIN B-12 TR) 1000 MCG TBCR Take by mouth.    [provider]  diclofenac sodium (VOLTAREN) 1 % GEL Apply 4 g topically 4 (four) times daily. 12/13/15   Burnard Hawthorne, FNP  folic acid (FOLVITE) 536 MCG tablet Take 400 mcg by mouth daily.    [provider]  glucose blood test strip Use to test blood sugar twice daily. 01/18/16   Burnard Hawthorne, FNP  metFORMIN (GLUCOPHAGE) 1000 MG tablet Take 1 tablet (1,000 mg total) by mouth 2 (two) times daily with a meal. 01/18/16   Arnett, Yvetta Coder, FNP  Multiple Vitamins-Minerals (CENTRUM SILVER ULTRA WOMENS) TABS Take by mouth.    [provider]  omeprazole (PRILOSEC) 20 MG capsule Take 1 capsule (20 mg total) by mouth daily. 02/21/16 02/20/17  Burnard Hawthorne, FNP  ONE TOUCH LANCETS MISC Use to test blood sugar twice daily. 01/18/16   Burnard Hawthorne, FNP  simvastatin (ZOCOR) 10 MG tablet Take 10 mg by mouth at bedtime. 05/30/19   [provider]     ALLERGIES:  No Known Allergies   SOCIAL HISTORY:  Social History   Socioeconomic History  . Marital status: Married    Spouse name: Not on file  . Number of  children: Not on file  . Years of education: Not on file  . Highest education level: Not on file  Occupational History  . Not on file  Tobacco Use  . Smoking status: Never Smoker  . Smokeless tobacco: Never Used  Substance and Sexual Activity  . Alcohol use: No  . Drug use: No  . Sexual activity: Not on file  Other Topics Concern  . Not on file  Social History Narrative   Lives in [place].    Married.    2 daughters who lives with them.      Diet- generally vegetarian.     Exercise- walks daily   Social Determinants of Health   Financial Resource Strain:   . Difficulty of Paying Living Expenses:   Food Insecurity:   . Worried About Charity fundraiser in the Last Year:   . Arboriculturist in the Last Year:   Transportation Needs:   . Film/video editor (Medical):   Marland Kitchen Lack of Transportation (Non-Medical):   Physical Activity:   . Days of Exercise per Week:   . Minutes of Exercise per Session:   Stress:   . Feeling of Stress :   Social Connections:   . Frequency of Communication with Friends and Family:   . Frequency of Social Gatherings with Friends and Family:   . Attends Religious Services:   . Active Member of Clubs or Organizations:   . Attends Archivist Meetings:   Marland Kitchen Marital Status:   Intimate Partner Violence:   . Fear of Current or Ex-Partner:   . Emotionally Abused:   Marland Kitchen Physically Abused:   . Sexually Abused:      FAMILY HISTORY:  Family History  Problem Relation Age of Onset  . Hyperlipidemia Mother   . Hypertension Mother   . Diabetes Mother   . Hyperlipidemia Father   . Stroke Father   . Hypertension Father   . Diabetes Father   . Breast cancer Neg Hx       REVIEW OF SYSTEMS:  Review of Systems  Constitutional: Negative for chills and fever.  HENT: Negative for congestion and sore throat.   Respiratory: Negative for cough and shortness of breath.   Cardiovascular: Negative for chest pain and palpitations.  Gastrointestinal: Positive for abdominal pain, nausea and vomiting. Negative for blood in stool, constipation and diarrhea.  Genitourinary: Negative for dysuria and urgency.  All other systems reviewed and are negative.   VITAL SIGNS:  Temp:  [97.7 F (36.5 C)] 97.7 F (36.5 C) (04/14 0731) Pulse Rate:  [72] 72 (04/14 0731) Resp:  [20] 20 (04/14 0731) BP: (135)/(72) 135/72 (04/14 0731) SpO2:  [100 %] 100 % (04/14 0731) Weight:  [58.2 kg] 58.2 kg (04/14 0726)       Weight: 58.2 kg      INTAKE/OUTPUT:  No intake/output data recorded.  PHYSICAL EXAM:  Physical Exam Vitals and nursing note reviewed. Exam conducted with a chaperone present.  Constitutional:      General: She is not in acute distress.    Appearance: She is well-developed and normal weight. She is not ill-appearing.  HENT:     Head: Normocephalic and atraumatic.  Eyes:     General: No scleral icterus.    Extraocular Movements: Extraocular movements intact.  Cardiovascular:     Rate and Rhythm: Normal rate and regular rhythm.     Heart sounds: Normal heart sounds. No murmur.  Pulmonary:     Effort: Pulmonary  effort is normal. No respiratory distress.     Breath sounds: Normal breath sounds.  Abdominal:     General: A surgical scar is present. There is no distension.     Palpations: Abdomen is soft.     Tenderness: There is no abdominal tenderness. There is no guarding or rebound.     Comments: Abdomen is soft, no significant tenderness, non-distended, no rebound or guarding, certainly no evidence of peritonitis   Genitourinary:    Comments: Deferred Skin:    General: Skin is warm and dry.     Coloration: Skin is not jaundiced or pale.  Neurological:     General: No focal deficit present.     Mental Status: She is alert and oriented to person, place, and time.  Psychiatric:        Mood and Affect: Mood normal.        Behavior: Behavior normal.      Labs:  CBC Latest Ref Rng & Units 08/19/2019 01/18/2016 12/14/2015  WBC 4.0 - 10.5 K/uL 14.4(H) 5.0 5.4  Hemoglobin 12.0 - 15.0 g/dL 11.4(L) 11.2(L) 10.6(L)  Hematocrit 36.0 - 46.0 % 35.9(L) 34.8(L) 33.4(L)  Platelets 150 - 400 K/uL 318 254.0 217.0   CMP Latest Ref Rng & Units 08/19/2019 12/14/2015  Glucose 70 - 99 mg/dL 204(H) 183(H)  BUN 8 - 23 mg/dL 22 15  Creatinine 0.44 - 1.00 mg/dL 0.67 0.61  Sodium 135 - 145 mmol/L 135 136  Potassium 3.5 - 5.1 mmol/L 4.0 4.1  Chloride 98 - 111 mmol/L 99 101  CO2 22 - 32 mmol/L 24 31  Calcium 8.9 - 10.3  mg/dL 9.4 9.4  Total Protein 6.5 - 8.1 g/dL 7.8 7.0  Total Bilirubin 0.3 - 1.2 mg/dL 1.0 0.7  Alkaline Phos 38 - 126 U/L 67 51  AST 15 - 41 U/L 20 16  ALT 0 - 44 U/L 17 24     Imaging studies:   CT Abdomen/Pelvis (08/19/2019) personally reviewed which shows dilated loops of small bowel without obvious signs of internal herniation, and radiologist report reviewed below:  IMPRESSION: 1. Evident small bowel obstruction with transition zone in right abdomen. Concern for internal hernia in the lateral right mid abdomen. No free air or portal venous air.  2. Multiple colonic diverticula, most notable in the sigmoid colon. No apparent diverticulitis evident on this study.  3.  Appendix appears normal.  4.  Fairly small hiatal hernia.  5. Slight ascites evident between loops of dilated bowel in the mid abdomen.  6.  Aortic Atherosclerosis (ICD10-I70.0).   Assessment/Plan: (ICD-10's: K46.609) 69 y.o. 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 I do not suspect she has an internal as there is unlikely any mesenteric defect given her surgical history is only positive for C-section, her laboratory results and examination are also reassuring.    - Will admit for observation  - Can hold off on NGT for now. If her symptoms return then recommend placing this  - Suspect lactic acidosis is secondary to dehydration from GI losses, will continue IVF resuscitation, trend lactic acid  - NPO for now; hopefully initiate CLD in AM  - pain control prn; antiemetics prn  - monitor abdominal examination, on-going bowel function   - No emergent surgical intervention   - medical management of comorbid conditions; PO home meds okay   - DVT prophylaxis  All of the above findings and recommendations were discussed with the  patient and her family (daughter at bedside), and all of patient's and her family's questions were answered to their expressed satisfaction.  Thank you for the opportunity to participate in this patient's care.   -- Edison Simon, PA-C [facility] 08/19/2019, 10:29 AM [phone] M-F: 7am - 4pm

## 2019-08-19 NOTE — H&P (Addendum)
[facility] SURGICAL HISTORY & PHYSICAL (cpt [phone])  HISTORY OF PRESENT ILLNESS (HPI):  69 y.o. female presented to [facility] ED today for evaluation of abdominal pain. Patient reportedly woke up around 2 AM this morning and noticed the acute onset of sharp and crampy umbilical abdominal pain. Accompanying this she reports multiple episodes of nausea and non-bloody, non-bilious emesis. She reports that belching temporarily relieves her discomfort but otherwise the pain has been constant since the onset. No fever, chills, cough, SOB, or CP. She has continued to have normal bowel movements, including one this morning but denied any flatus. Daughter states she had a similar episode in the past but this resolved spontaneously at home. Only previous abdominal surgery is C-section x 2. Work up in the ED revealed slight leukocytosis to 14K, renal function was within normal limits, no electrolyte derangement, lipase was normal, lactic acid was 2.4 but suspect some under resuscitation from GI losses, and CT abdomen/pelvis was concerning for dilated loops of small bowel consistent with possible obstruction.    Surgery is consulted by emergency medicine physician Dr. Blake Divine, MD in this context for evaluation and management of possible SBO.   PAST MEDICAL HISTORY (PMH):  Past Medical History:  Diagnosis Date   Arthritis    Depression    Diabetes mellitus without complication (HCC)    GERD (gastroesophageal reflux disease)    Hyperlipidemia    Hyperlipidemia    Thyroid disorder    UTI (lower urinary tract infection)      PAST SURGICAL HISTORY ([place]):  Past Surgical History:  Procedure Laterality Date   CESAREAN SECTION     ESOPHAGOGASTRODUODENOSCOPY       MEDICATIONS:  Prior to Admission medications   Medication Sig Start Date End Date Taking? Authorizing Provider  alendronate (FOSAMAX) 10 MG tablet Take 10 mg by mouth daily before breakfast. Take with a full glass of water on  an empty stomach.    [provider]  Blood Glucose Monitoring Suppl (ONE TOUCH ULTRA MINI) w/Device KIT Use to test blood sugar twice daily. 01/18/16   Burnard Hawthorne, FNP  Calcium Carbonate-Vitamin D 600-400 MG-UNIT tablet Take by mouth.    [provider]  Cholecalciferol (VITAMIN D3) 1000 units CAPS Take by mouth.    [provider]  Cyanocobalamin (RA VITAMIN B-12 TR) 1000 MCG TBCR Take by mouth.    [provider]  diclofenac sodium (VOLTAREN) 1 % GEL Apply 4 g topically 4 (four) times daily. 12/13/15   Burnard Hawthorne, FNP  folic acid (FOLVITE) 754 MCG tablet Take 400 mcg by mouth daily.    [provider]  glucose blood test strip Use to test blood sugar twice daily. 01/18/16   Burnard Hawthorne, FNP  metFORMIN (GLUCOPHAGE) 1000 MG tablet Take 1 tablet (1,000 mg total) by mouth 2 (two) times daily with a meal. 01/18/16   Arnett, Yvetta Coder, FNP  Multiple Vitamins-Minerals (CENTRUM SILVER ULTRA WOMENS) TABS Take by mouth.    [provider]  omeprazole (PRILOSEC) 20 MG capsule Take 1 capsule (20 mg total) by mouth daily. 02/21/16 02/20/17  Burnard Hawthorne, FNP  ONE TOUCH LANCETS MISC Use to test blood sugar twice daily. 01/18/16   Burnard Hawthorne, FNP  simvastatin (ZOCOR) 10 MG tablet Take 10 mg by mouth at bedtime. 05/30/19   [provider]     ALLERGIES:  No Known Allergies   SOCIAL HISTORY:  Social History   Socioeconomic History   Marital  status: Married    Spouse name: Not on file   Number of children: Not on file   Years of education: Not on file   Highest education level: Not on file  Occupational History   Not on file  Tobacco Use   Smoking status: Never Smoker   Smokeless tobacco: Never Used  Substance and Sexual Activity   Alcohol use: No   Drug use: No   Sexual activity: Not on file  Other Topics Concern   Not on file  Social History Narrative   Lives in [place].    Married.    2  daughters who lives with them.      Diet- generally vegetarian.    Exercise- walks daily   Social Determinants of Health   Financial Resource Strain:    Difficulty of Paying Living Expenses:   Food Insecurity:    Worried About Charity fundraiser in the Last Year:    Arboriculturist in the Last Year:   Transportation Needs:    Film/video editor (Medical):    Lack of Transportation (Non-Medical):   Physical Activity:    Days of Exercise per Week:    Minutes of Exercise per Session:   Stress:    Feeling of Stress :   Social Connections:    Frequency of Communication with Friends and Family:    Frequency of Social Gatherings with Friends and Family:    Attends Religious Services:    Active Member of Clubs or Organizations:    Attends Music therapist:    Marital Status:   Intimate Partner Violence:    Fear of Current or Ex-Partner:    Emotionally Abused:    Physically Abused:    Sexually Abused:      FAMILY HISTORY:  Family History  Problem Relation Age of Onset   Hyperlipidemia Mother    Hypertension Mother    Diabetes Mother    Hyperlipidemia Father    Stroke Father    Hypertension Father    Diabetes Father    Breast cancer Neg Hx       REVIEW OF SYSTEMS:  Review of Systems  Constitutional: Negative for chills and fever.  HENT: Negative for congestion and sore throat.   Respiratory: Negative for cough and shortness of breath.   Cardiovascular: Negative for chest pain and palpitations.  Gastrointestinal: Positive for abdominal pain, nausea and vomiting. Negative for blood in stool, constipation and diarrhea.  Genitourinary: Negative for dysuria and urgency.  All other systems reviewed and are negative.   VITAL SIGNS:  Temp:  [97.7 F (36.5 C)] 97.7 F (36.5 C) (04/14 0731) Pulse Rate:  [72] 72 (04/14 0731) Resp:  [20] 20 (04/14 0731) BP: (135)/(72) 135/72 (04/14 0731) SpO2:  [100 %] 100 % (04/14 0731) Weight:  [58.2 kg] 58.2 kg (04/14  0726)       Weight: 58.2 kg     INTAKE/OUTPUT:  No intake/output data recorded.  PHYSICAL EXAM:  Physical Exam Vitals and nursing note reviewed. Exam conducted with a chaperone present.  Constitutional:      General: She is not in acute distress.    Appearance: She is well-developed and normal weight. She is not ill-appearing.  HENT:     Head: Normocephalic and atraumatic.  Eyes:     General: No scleral icterus.    Extraocular Movements: Extraocular movements intact.  Cardiovascular:     Rate and Rhythm: Normal rate and regular rhythm.     Heart  sounds: Normal heart sounds. No murmur.  Pulmonary:     Effort: Pulmonary effort is normal. No respiratory distress.     Breath sounds: Normal breath sounds.  Abdominal:     General: A surgical scar is present. There is no distension.     Palpations: Abdomen is soft.     Tenderness: There is no abdominal tenderness. There is no guarding or rebound.     Comments: Abdomen is soft, no significant tenderness, non-distended, no rebound or guarding, certainly no evidence of peritonitis   Genitourinary:    Comments: Deferred Skin:    General: Skin is warm and dry.     Coloration: Skin is not jaundiced or pale.  Neurological:     General: No focal deficit present.     Mental Status: She is alert and oriented to person, place, and time.  Psychiatric:        Mood and Affect: Mood normal.        Behavior: Behavior normal.      Labs:  CBC Latest Ref Rng & Units 08/19/2019 01/18/2016 12/14/2015  WBC 4.0 - 10.5 K/uL 14.4(H) 5.0 5.4  Hemoglobin 12.0 - 15.0 g/dL 11.4(L) 11.2(L) 10.6(L)  Hematocrit 36.0 - 46.0 % 35.9(L) 34.8(L) 33.4(L)  Platelets 150 - 400 K/uL 318 254.0 217.0   CMP Latest Ref Rng & Units 08/19/2019 12/14/2015  Glucose 70 - 99 mg/dL 204(H) 183(H)  BUN 8 - 23 mg/dL 22 15  Creatinine 0.44 - 1.00 mg/dL 0.67 0.61  Sodium 135 - 145 mmol/L 135 136  Potassium 3.5 - 5.1 mmol/L 4.0 4.1  Chloride 98 - 111 mmol/L 99 101  CO2 22 - 32  mmol/L 24 31  Calcium 8.9 - 10.3 mg/dL 9.4 9.4  Total Protein 6.5 - 8.1 g/dL 7.8 7.0  Total Bilirubin 0.3 - 1.2 mg/dL 1.0 0.7  Alkaline Phos 38 - 126 U/L 67 51  AST 15 - 41 U/L 20 16  ALT 0 - 44 U/L 17 24     Imaging studies:   CT Abdomen/Pelvis (08/19/2019) personally reviewed which shows dilated loops of small bowel without obvious signs of internal herniation, and radiologist report reviewed below:  IMPRESSION: 1. Evident small bowel obstruction with transition zone in right abdomen. Concern for internal hernia in the lateral right mid abdomen. No free air or portal venous air.   2. Multiple colonic diverticula, most notable in the sigmoid colon. No apparent diverticulitis evident on this study.   3.  Appendix appears normal.   4.  Fairly small hiatal hernia.   5. Slight ascites evident between loops of dilated bowel in the mid abdomen.   6.  Aortic Atherosclerosis (ICD10-I70.0).   Assessment/Plan: (ICD-10's: K23.609) 69 y.o. 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 I do not suspect she has an internal hernia as there is unlikely any mesenteric defect given her surgical history is only positive for C-section and the area of concern is outside of the surgical field for pelvic operations; her laboratory results and examination are also reassuring.    - Will admit for observation  - Can hold off on NGT for now. If her symptoms return then recommend placing this  - Suspect lactic acidosis is secondary to dehydration from GI losses, will continue IVF resuscitation, trend lactic acid  - NPO for now; hopefully initiate CLD in AM  - pain control prn; antiemetics prn  -  monitor abdominal examination, on-going bowel function   - No emergent surgical intervention   - medical management of comorbid  conditions; PO home meds okay   - DVT prophylaxis  All of the above findings and recommendations were discussed with the patient and her family (daughter at bedside), and all of patient's and her family's questions were answered to their expressed satisfaction.  Thank you for the opportunity to participate in this patient's care.   -- Edison Simon, PA-C [facility] 08/19/2019, 10:29 AM [phone] M-F: 7am - 4pm  I saw and evaluated the patient.  I agree with the above documentation, exam, and plan, which I have edited where appropriate. Fredirick Maudlin  12:14 PM

## 2019-08-19 NOTE — ED Triage Notes (Addendum)
C/O severe abdominal pain and vomiting since around 0200.  Patient awake and alert.  Moaning in pain.  Belching frequently.

## 2019-08-19 NOTE — Progress Notes (Signed)
PHARMACIST - PHYSICIAN COMMUNICATION  CONCERNING: P&T Medication Policy Regarding Oral Bisphosphonates  RECOMMENDATION: Your order for alendronate (Fosamax), ibandronate (Boniva), or risedronate (Actonel) has been discontinued at this time.  If the patient's post-hospital medical condition warrants safe use of this class of drugs, please resume the pre-hospital regimen upon discharge.  DESCRIPTION:  Alendronate (Fosamax), ibandronate (Boniva), and risedronate (Actonel) can cause severe esophageal erosions in patients who are unable to remain upright at least 30 minutes after taking this medication.   Since brief interruptions in therapy are thought to have minimal impact on bone mineral density, the Pharmacy & Therapeutics Committee has established that bisphosphonate orders should be routinely discontinued during hospitalization.   To override this safety policy and permit administration of Boniva, Fosamax, or Actonel in the hospital, prescribers must write "DO NOT HOLD" in the comments section when placing the order for this class of medications.  Gardner Candle, PharmD, BCPS Clinical Pharmacist 08/19/2019 11:50 AM

## 2019-08-20 DIAGNOSIS — E872 Acidosis: Secondary | ICD-10-CM | POA: Diagnosis present

## 2019-08-20 DIAGNOSIS — E02 Subclinical iodine-deficiency hypothyroidism: Secondary | ICD-10-CM | POA: Diagnosis present

## 2019-08-20 DIAGNOSIS — I10 Essential (primary) hypertension: Secondary | ICD-10-CM | POA: Diagnosis present

## 2019-08-20 DIAGNOSIS — Z823 Family history of stroke: Secondary | ICD-10-CM | POA: Diagnosis not present

## 2019-08-20 DIAGNOSIS — Z8249 Family history of ischemic heart disease and other diseases of the circulatory system: Secondary | ICD-10-CM | POA: Diagnosis not present

## 2019-08-20 DIAGNOSIS — Z20822 Contact with and (suspected) exposure to covid-19: Secondary | ICD-10-CM | POA: Diagnosis present

## 2019-08-20 DIAGNOSIS — K567 Ileus, unspecified: Secondary | ICD-10-CM | POA: Diagnosis present

## 2019-08-20 DIAGNOSIS — Z7984 Long term (current) use of oral hypoglycemic drugs: Secondary | ICD-10-CM | POA: Diagnosis not present

## 2019-08-20 DIAGNOSIS — Z79899 Other long term (current) drug therapy: Secondary | ICD-10-CM | POA: Diagnosis not present

## 2019-08-20 DIAGNOSIS — E785 Hyperlipidemia, unspecified: Secondary | ICD-10-CM | POA: Diagnosis present

## 2019-08-20 DIAGNOSIS — E119 Type 2 diabetes mellitus without complications: Secondary | ICD-10-CM | POA: Diagnosis present

## 2019-08-20 DIAGNOSIS — K529 Noninfective gastroenteritis and colitis, unspecified: Secondary | ICD-10-CM | POA: Diagnosis present

## 2019-08-20 DIAGNOSIS — M810 Age-related osteoporosis without current pathological fracture: Secondary | ICD-10-CM | POA: Diagnosis present

## 2019-08-20 DIAGNOSIS — Z833 Family history of diabetes mellitus: Secondary | ICD-10-CM | POA: Diagnosis not present

## 2019-08-20 DIAGNOSIS — E860 Dehydration: Secondary | ICD-10-CM | POA: Diagnosis present

## 2019-08-20 DIAGNOSIS — Z7983 Long term (current) use of bisphosphonates: Secondary | ICD-10-CM | POA: Diagnosis not present

## 2019-08-20 DIAGNOSIS — K219 Gastro-esophageal reflux disease without esophagitis: Secondary | ICD-10-CM | POA: Diagnosis present

## 2019-08-20 DIAGNOSIS — M1990 Unspecified osteoarthritis, unspecified site: Secondary | ICD-10-CM | POA: Diagnosis present

## 2019-08-20 DIAGNOSIS — Z83438 Family history of other disorder of lipoprotein metabolism and other lipidemia: Secondary | ICD-10-CM | POA: Diagnosis not present

## 2019-08-20 DIAGNOSIS — K5651 Intestinal adhesions [bands], with partial obstruction: Secondary | ICD-10-CM | POA: Diagnosis present

## 2019-08-20 LAB — BASIC METABOLIC PANEL
Anion gap: 7 (ref 5–15)
BUN: 11 mg/dL (ref 8–23)
CO2: 28 mmol/L (ref 22–32)
Calcium: 8.6 mg/dL — ABNORMAL LOW (ref 8.9–10.3)
Chloride: 104 mmol/L (ref 98–111)
Creatinine, Ser: 0.67 mg/dL (ref 0.44–1.00)
GFR calc Af Amer: >60 mL/min (ref >60)
GFR calc non Af Amer: >60 mL/min (ref >60)
Glucose, Bld: 97 mg/dL (ref 70–99)
Potassium: 3.5 mmol/L (ref 3.5–5.1)
Sodium: 139 mmol/L (ref 135–145)

## 2019-08-20 LAB — CBC
HCT: 28.9 % — ABNORMAL LOW (ref 36.0–46.0)
Hemoglobin: 9.2 g/dL — ABNORMAL LOW (ref 12.0–15.0)
MCH: 21.1 pg — ABNORMAL LOW (ref 26.0–34.0)
MCHC: 31.8 g/dL (ref 30.0–36.0)
MCV: 66.4 fL — ABNORMAL LOW (ref 80.0–100.0)
Platelets: 251 10*3/uL (ref 150–400)
RBC: 4.35 MIL/uL (ref 3.87–5.11)
RDW: 14.8 % (ref 11.5–15.5)
WBC: 5.7 10*3/uL (ref 4.0–10.5)
nRBC: 0 % (ref 0.0–0.2)

## 2019-08-20 LAB — MAGNESIUM: Magnesium: 2 mg/dL (ref 1.7–2.4)

## 2019-08-20 LAB — PHOSPHORUS: Phosphorus: 3.8 mg/dL (ref 2.5–4.6)

## 2019-08-20 NOTE — Progress Notes (Addendum)
[facility] SURGICAL PROGRESS NOTE (cpt [id number])  Hospital Day(s): 0.   Interval History: Patient seen and examined, no acute events or new complaints overnight. Patient reports she is feeling great this morning, denies abdominal pain, fever, chills, nausea, or emesis. Biggest complaint is back pain related to the bed. Labs are unremarkable. She notes that she has started to pass lots of flatus.   Review of Systems:  Constitutional: denies fever, chills  HEENT: denies cough or congestion  Respiratory: denies any shortness of breath  Cardiovascular: denies chest pain or palpitations  Gastrointestinal: denies abdominal pain, N/V, or diarrhea/and bowel function as per interval history Genitourinary: denies burning with urination or urinary frequency Musculoskeletal: + back pain  Vital signs in last 24 hours: [min-max] current  Temp:  [98 F (36.7 C)-98.9 F (37.2 C)] 98 F (36.7 C) (04/15 0525) Pulse Rate:  [66-77] 71 (04/15 0525) Resp:  [9-20] 20 (04/15 0525) BP: (115-183)/(47-83) 121/47 (04/15 0525) SpO2:  [96 %-100 %] 100 % (04/15 0525)     Height: 4\' 11"  (149.9 cm) Weight: 58.2 kg     Intake/Output last 2 shifts:  04/14 0701 - 04/15 0700 In: 1390.1 [I.V.:1390.1] Out: 200 [Urine:200]   Physical Exam:  Constitutional: alert, cooperative and no distress  HENT: normocephalic without obvious abnormality  Eyes: PERRL, EOM's grossly intact and symmetric  Respiratory: breathing non-labored at rest  Cardiovascular: regular rate and sinus rhythm  Gastrointestinal: Soft, non-tender, and non-distended, no rebound/guarding Musculoskeletal: No edema or wounds, motor and sensation grossly intact, NT    Labs:  CBC Latest Ref Rng & Units 08/20/2019 08/19/2019 01/18/2016  WBC 4.0 - 10.5 K/uL 5.7 14.4(H) 5.0  Hemoglobin 12.0 - 15.0 g/dL 9.2(L) 11.4(L) 11.2(L)  Hematocrit 36.0 - 46.0 % 28.9(L) 35.9(L) 34.8(L)  Platelets 150 - 400 K/uL 251 318 254.0   CMP Latest Ref Rng &  Units 08/20/2019 08/19/2019 12/14/2015  Glucose 70 - 99 mg/dL 97 204(H) 183(H)  BUN 8 - 23 mg/dL 11 22 15   Creatinine 0.44 - 1.00 mg/dL 0.67 0.67 0.61  Sodium 135 - 145 mmol/L 139 135 136  Potassium 3.5 - 5.1 mmol/L 3.5 4.0 4.1  Chloride 98 - 111 mmol/L 104 99 101  CO2 22 - 32 mmol/L 28 24 31   Calcium 8.9 - 10.3 mg/dL 8.6(L) 9.4 9.4  Total Protein 6.5 - 8.1 g/dL - 7.8 7.0  Total Bilirubin 0.3 - 1.2 mg/dL - 1.0 0.7  Alkaline Phos 38 - 126 U/L - 67 51  AST 15 - 41 U/L - 20 16  ALT 0 - 44 U/L - 17 24     Imaging studies: No new pertinent imaging studies   Assessment/Plan: (ICD-10's: K40.609) 69 y.o. female with clinically resolved obstructive presentation, favor ileus secondary to gastroenteritis -vs- small bowel obstruction secondary to adhesive disease.   - Initiate diet, ADAT   - Wean from IVF   - pain control prn; antiemetics prn             - monitor abdominal examination, on-going bowel function              - No emergent surgical intervention              - medical management of comorbid conditions; PO home meds okay     - Discharge Planning if tolerates advancement of diet, okay for home in 24-48 hours   All of the above findings and recommendations were discussed with the patient, and the medical team, and all  of patient's questions were answered to her expressed satisfaction.  -- Lynden Oxford, PA-C [facility] 08/20/2019, 7:34 AM [phone] M-F: 7am - 4pm  I saw and evaluated the patient.  I agree with the above documentation, exam, and plan, which I have edited where appropriate. Duanne Guess  12:38 PM

## 2019-08-20 NOTE — Progress Notes (Signed)
Patient tolerated meals well. Fluids stopped per Laqueta Due, PA.

## 2019-08-21 NOTE — Progress Notes (Signed)
Janet Moyer to be D/C'd home per MD order.  Discussed prescriptions and follow up appointments with the patient. Prescriptions given to patient, medication list explained in detail. Pt verbalized understanding.  Allergies as of 08/21/2019   No Known Allergies     Medication List    TAKE these medications   Calcium Carbonate-Vitamin D 600-400 MG-UNIT tablet Take by mouth.   Centrum Silver Ultra Womens Tabs Take by mouth.   folic acid 800 MCG tablet Commonly known as: FOLVITE Take 400 mcg by mouth daily.   omeprazole 40 MG capsule Commonly known as: PRILOSEC Take 40 mg by mouth daily.   RA Vitamin B-12 TR 1000 MCG Tbcr Generic drug: Cyanocobalamin Take by mouth.   simvastatin 10 MG tablet Commonly known as: ZOCOR Take 10 mg by mouth at bedtime.   Vitamin D3 25 MCG (1000 UT) Caps Take by mouth.       Vitals:   08/20/19 2009 08/21/19 0544  BP: 136/73 (!) 118/55  Pulse: 66 65  Resp: 20 17  Temp: 98.2 F (36.8 C) 98 F (36.7 C)  SpO2: 100% 100%    Skin clean, dry and intact without evidence of skin break down, no evidence of skin tears noted. IV catheter discontinued intact. Site without signs and symptoms of complications. Dressing and pressure applied. Pt denies pain at this time. No complaints noted.  An After Visit Summary was printed and given to the patient. Patient escorted via WC, and D/C home via private auto.  Madie Reno, RN

## 2019-08-21 NOTE — Discharge Summary (Addendum)
[facility] SURGICAL DISCHARGE SUMMARY (cpt: [phone])  Patient ID: Janet Moyer MRN: [id number] DOB/AGE: [birth year] 69 y.o.  Admit date: 08/19/2019 Discharge date: 08/21/2019  Discharge Diagnoses Patient Active Problem List   Diagnosis Date Noted   Ileus [facility]) 08/19/2019   Consultants None  Procedures None   HPI: 69 y.o. female presented to [facility] ED today for evaluation of abdominal pain. Patient reportedly woke up around 2 AM this morning and noticed the acute onset of sharp and crampy umbilical abdominal pain. Accompanying this she reports multiple episodes of nausea and non-bloody, non-bilious emesis. She reports that belching temporarily relieves her discomfort but otherwise the pain has been constant since the onset. No fever, chills, cough, SOB, or CP. She has continued to have normal bowel movements, including one this morning but denied any flatus. Daughter states she had a similar episode in the past but this resolved spontaneously at home. Only previous abdominal surgery is C-section x 2. Work up in the ED revealed slight leukocytosis to 14K, renal function was within normal limits, no electrolyte derangement, lipase was normal, lactic acid was 2.4 but suspect some under resuscitation from GI losses, and CT abdomen/pelvis was concerning for dilated loops of small bowel consistent with possible obstruction.    Hospital Course: She was admitted to general surgery for conservative management and observation. The following day her symptoms had resolved and she had return of bowel function. Advancement of patient's diet and ambulation were well-tolerated. The remainder of patient's hospital course was essentially unremarkable, and discharge planning was initiated accordingly with patient safely able to be discharged home with appropriate discharge instructions, pain control, and outpatient follow-up after all of her questions were answered to her expressed  satisfaction.   Discharge Condition: Good   Physical Examination:  Constitutional: alert, cooperative and no distress  HENT: normocephalic without obvious abnormality  Eyes: PERRL, EOM's grossly intact and symmetric  Respiratory: breathing non-labored at rest  Cardiovascular: regular rate and sinus rhythm  Gastrointestinal: Soft, non-tender, and non-distended, no rebound/guarding Musculoskeletal: No edema or wounds, motor and sensation grossly intact, NT    Allergies as of 08/21/2019   No Known Allergies      Medication List     TAKE these medications    Calcium Carbonate-Vitamin D 600-400 MG-UNIT tablet Take by mouth.   Centrum Silver Ultra Womens Tabs Take by mouth.   folic acid 185 MCG tablet Commonly known as: FOLVITE Take 400 mcg by mouth daily.   omeprazole 40 MG capsule Commonly known as: PRILOSEC Take 40 mg by mouth daily.   RA Vitamin B-12 TR 1000 MCG Tbcr Generic drug: Cyanocobalamin Take by mouth.   simvastatin 10 MG tablet Commonly known as: ZOCOR Take 10 mg by mouth at bedtime.   Vitamin D3 25 MCG (1000 UT) Caps Take by mouth.         Follow-up Information     Hortencia Pilar, MD. Schedule an appointment as soon as possible for a visit in 2 day(s).   Specialty: Family Medicine Why: Hospital follow up ileus -vs- gastroenteritis -vs- SBO Contact information: [place] [address] [phone]             Time spent on discharge management including discussion of hospital course, clinical condition, outpatient instructions, prescriptions, and follow up with the patient and members of the medical team: >30 minutes  -- Edison Simon , PA-C [facility]  08/21/2019, 7:44 AM [phone] M-F: 7am - 4pm   I saw  and evaluated the patient.  I agree with the above documentation, exam, and plan, which I have edited where appropriate. Duanne Guess  9:02 AM

## 2019-09-10 ENCOUNTER — Other Ambulatory Visit: Payer: Self-pay

## 2019-09-10 ENCOUNTER — Inpatient Hospital Stay
Admission: EM | Admit: 2019-09-10 | Discharge: 2019-09-12 | DRG: 390 | Disposition: A | Discharge status: Home / Self Care | Payer: Medicare Other | Attending: Surgery | Admitting: Surgery

## 2019-09-10 ENCOUNTER — Emergency Department: Payer: Medicare Other

## 2019-09-10 DIAGNOSIS — M810 Age-related osteoporosis without current pathological fracture: Secondary | ICD-10-CM | POA: Diagnosis present

## 2019-09-10 DIAGNOSIS — K56609 Unspecified intestinal obstruction, unspecified as to partial versus complete obstruction: Secondary | ICD-10-CM | POA: Diagnosis not present

## 2019-09-10 DIAGNOSIS — K219 Gastro-esophageal reflux disease without esophagitis: Secondary | ICD-10-CM | POA: Diagnosis present

## 2019-09-10 DIAGNOSIS — Z83438 Family history of other disorder of lipoprotein metabolism and other lipidemia: Secondary | ICD-10-CM

## 2019-09-10 DIAGNOSIS — D563 Thalassemia minor: Secondary | ICD-10-CM | POA: Diagnosis present

## 2019-09-10 DIAGNOSIS — E785 Hyperlipidemia, unspecified: Secondary | ICD-10-CM | POA: Diagnosis present

## 2019-09-10 DIAGNOSIS — Z8249 Family history of ischemic heart disease and other diseases of the circulatory system: Secondary | ICD-10-CM

## 2019-09-10 DIAGNOSIS — K56600 Partial intestinal obstruction, unspecified as to cause: Principal | ICD-10-CM | POA: Diagnosis present

## 2019-09-10 DIAGNOSIS — K5730 Diverticulosis of large intestine without perforation or abscess without bleeding: Secondary | ICD-10-CM | POA: Diagnosis present

## 2019-09-10 DIAGNOSIS — Z833 Family history of diabetes mellitus: Secondary | ICD-10-CM

## 2019-09-10 DIAGNOSIS — E02 Subclinical iodine-deficiency hypothyroidism: Secondary | ICD-10-CM | POA: Diagnosis present

## 2019-09-10 DIAGNOSIS — Z20822 Contact with and (suspected) exposure to covid-19: Secondary | ICD-10-CM | POA: Diagnosis present

## 2019-09-10 DIAGNOSIS — Z79899 Other long term (current) drug therapy: Secondary | ICD-10-CM

## 2019-09-10 DIAGNOSIS — F329 Major depressive disorder, single episode, unspecified: Secondary | ICD-10-CM | POA: Diagnosis present

## 2019-09-10 DIAGNOSIS — E119 Type 2 diabetes mellitus without complications: Secondary | ICD-10-CM | POA: Diagnosis present

## 2019-09-10 LAB — COMPREHENSIVE METABOLIC PANEL
ALT: 17 U/L (ref 0–44)
AST: 16 U/L (ref 15–41)
Albumin: 4.4 g/dL (ref 3.5–5.0)
Alkaline Phosphatase: 55 U/L (ref 38–126)
Anion gap: 8 (ref 5–15)
BUN: 17 mg/dL (ref 8–23)
CO2: 27 mmol/L (ref 22–32)
Calcium: 9.5 mg/dL (ref 8.9–10.3)
Chloride: 103 mmol/L (ref 98–111)
Creatinine, Ser: 0.53 mg/dL (ref 0.44–1.00)
GFR calc Af Amer: >60 mL/min (ref >60)
GFR calc non Af Amer: >60 mL/min (ref >60)
Glucose, Bld: 163 mg/dL — ABNORMAL HIGH (ref 70–99)
Potassium: 4.2 mmol/L (ref 3.5–5.1)
Sodium: 138 mmol/L (ref 135–145)
Total Bilirubin: 1.1 mg/dL (ref 0.3–1.2)
Total Protein: 7.3 g/dL (ref 6.5–8.1)

## 2019-09-10 LAB — RESPIRATORY PANEL BY RT PCR (FLU A&B, COVID)
Influenza A by PCR: NEGATIVE
Influenza B by PCR: NEGATIVE
SARS Coronavirus 2 by RT PCR: NEGATIVE

## 2019-09-10 LAB — LIPASE, BLOOD: Lipase: 37 U/L (ref 11–51)

## 2019-09-10 LAB — CBC
HCT: 34 % — ABNORMAL LOW (ref 36.0–46.0)
Hemoglobin: 10.8 g/dL — ABNORMAL LOW (ref 12.0–15.0)
MCH: 20.9 pg — ABNORMAL LOW (ref 26.0–34.0)
MCHC: 31.8 g/dL (ref 30.0–36.0)
MCV: 65.8 fL — ABNORMAL LOW (ref 80.0–100.0)
Platelets: 307 10*3/uL (ref 150–400)
RBC: 5.17 MIL/uL — ABNORMAL HIGH (ref 3.87–5.11)
RDW: 15.2 % (ref 11.5–15.5)
WBC: 12.1 10*3/uL — ABNORMAL HIGH (ref 4.0–10.5)
nRBC: 0 % (ref 0.0–0.2)

## 2019-09-10 MED ORDER — SODIUM CHLORIDE 0.9 % IV SOLN: INTRAVENOUS | Status: DC

## 2019-09-10 MED ORDER — ENOXAPARIN SODIUM 40 MG/0.4ML ~~LOC~~ SOLN
40.0000 mg | SUBCUTANEOUS | Status: DC
  Administered 2019-09-10 – 2019-09-11 (×2): 40 mg via SUBCUTANEOUS
  Filled 2019-09-10 (×2): qty 0.4

## 2019-09-10 MED ORDER — ONDANSETRON 4 MG PO TBDP: 4.0000 mg | ORAL_TABLET | Freq: Four times a day (QID) | ORAL | Status: DC | PRN

## 2019-09-10 MED ORDER — ONDANSETRON HCL 4 MG/2ML IJ SOLN: 4.0000 mg | Freq: Four times a day (QID) | INTRAMUSCULAR | Status: DC | PRN

## 2019-09-10 MED ORDER — MORPHINE SULFATE (PF) 2 MG/ML IV SOLN: 2.0000 mg | INTRAVENOUS | Status: DC | PRN

## 2019-09-10 MED ORDER — ACETAMINOPHEN 500 MG PO TABS
1000.0000 mg | ORAL_TABLET | Freq: Four times a day (QID) | ORAL | Status: DC
  Filled 2019-09-10 (×2): qty 2

## 2019-09-10 MED ORDER — IOHEXOL 300 MG/ML  SOLN
75.0000 mL | Freq: Once | INTRAMUSCULAR | Status: AC | PRN
  Administered 2019-09-10: 75 mL via INTRAVENOUS

## 2019-09-10 MED ORDER — PANTOPRAZOLE SODIUM 40 MG PO TBEC
40.0000 mg | DELAYED_RELEASE_TABLET | Freq: Every day | ORAL | Status: DC
  Administered 2019-09-12: 40 mg via ORAL
  Filled 2019-09-10: qty 1

## 2019-09-10 MED ORDER — MORPHINE SULFATE (PF) 4 MG/ML IV SOLN
4.0000 mg | Freq: Once | INTRAVENOUS | Status: AC
  Administered 2019-09-10: 4 mg via INTRAVENOUS
  Filled 2019-09-10: qty 1

## 2019-09-10 MED ORDER — ONDANSETRON HCL 4 MG/2ML IJ SOLN
4.0000 mg | Freq: Once | INTRAMUSCULAR | Status: AC
  Administered 2019-09-10: 4 mg via INTRAVENOUS
  Filled 2019-09-10: qty 2

## 2019-09-10 MED ORDER — FOLIC ACID 1 MG PO TABS
500.0000 ug | ORAL_TABLET | Freq: Every day | ORAL | Status: DC
  Administered 2019-09-12: 0.5 mg via ORAL
  Filled 2019-09-10: qty 1

## 2019-09-10 MED ORDER — SIMVASTATIN 10 MG PO TABS
10.0000 mg | ORAL_TABLET | Freq: Every day | ORAL | Status: DC
  Administered 2019-09-10 – 2019-09-11 (×2): 10 mg via ORAL
  Filled 2019-09-10 (×3): qty 1

## 2019-09-10 NOTE — ED Notes (Signed)
Pt went to CT

## 2019-09-10 NOTE — ED Provider Notes (Signed)
[facility] Emergency Department Provider Note   ____________________________________________    I have reviewed the triage vital signs and the nursing notes.   HISTORY  Chief Complaint Abdominal Pain     HPI Janet Moyer is a 69 y.o. female with history of diabetes who presents with complaints of abdominal pain and nausea.  Patient reports she woke up this morning with periumbilical abdominal pain with a sense of fullness.  She reports nausea.  Has had bowel movements this morning.  Describes pain as the same that brought her into the hospital recently.  Review of medical records demonstrates an admission in mid April for possible small bowel obstruction versus ileus.  Patient has a history of C-section no other abdominal surgeries  Past Medical History:  Diagnosis Date  . Arthritis   . Depression   . Diabetes mellitus without complication ([place])   . GERD (gastroesophageal reflux disease)   . Hyperlipidemia   . Hyperlipidemia   . Thyroid disorder   . UTI (lower urinary tract infection)     Patient Active Problem List   Diagnosis Date Noted  . Ileus ([place]) 08/19/2019  . Subclinical hypothyroidism 02/21/2016  . Thalassemia minor 01/18/2016  . Hyperpigmentation of skin 01/18/2016  . Arthritis 12/13/2015  . Routine physical examination 12/13/2015  . Type 2 diabetes mellitus ([place]) 12/13/2015  . Osteoporosis 12/13/2015  . Hyperlipidemia 12/13/2015  . GERD (gastroesophageal reflux disease) 12/13/2015    Past Surgical History:  Procedure Laterality Date  . CESAREAN SECTION    . ESOPHAGOGASTRODUODENOSCOPY      Prior to Admission medications   Medication Sig Start Date End Date Taking? Authorizing Provider  Calcium Carbonate-Vitamin D 600-400 MG-UNIT tablet Take by mouth.    [provider]  Cholecalciferol (VITAMIN D3) 1000 units CAPS Take by mouth.    [provider]  Cyanocobalamin (RA VITAMIN B-12 TR) 1000 MCG TBCR Take  by mouth.    [provider]  folic acid (FOLVITE) 440 MCG tablet Take 400 mcg by mouth daily.    [provider]  Multiple Vitamins-Minerals (CENTRUM SILVER ULTRA WOMENS) TABS Take by mouth.    [provider]  omeprazole (PRILOSEC) 40 MG capsule Take 40 mg by mouth daily.    [provider]  simvastatin (ZOCOR) 10 MG tablet Take 10 mg by mouth at bedtime. 05/30/19   [provider]     Allergies Patient has no known allergies.  Family History  Problem Relation Age of Onset  . Hyperlipidemia Mother   . Hypertension Mother   . Diabetes Mother   . Hyperlipidemia Father   . Stroke Father   . Hypertension Father   . Diabetes Father   . Breast cancer Neg Hx     Social History Social History   Tobacco Use  . Smoking status: Never Smoker  . Smokeless tobacco: Never Used  Substance Use Topics  . Alcohol use: No  . Drug use: No    Review of Systems  Constitutional: No fever/chills Eyes: No visual changes.  ENT: No sore throat. Cardiovascular: Denies chest pain. Respiratory: Denies shortness of breath. Gastrointestinal: As above Genitourinary: Negative for dysuria. Musculoskeletal: Negative for back pain. Skin: Negative for rash. Neurological: Negative for headaches   ____________________________________________   PHYSICAL EXAM:  VITAL SIGNS: ED Triage Vitals  Enc Vitals Group     BP 09/10/19 1202 140/63     Pulse Rate 09/10/19 1202 71     Resp 09/10/19 1202 16  Temp 09/10/19 1202 97.7 F (36.5 C)     Temp Source 09/10/19 1202 Oral     SpO2 09/10/19 1202 99 %     Weight 09/10/19 1201 49.9 kg (110 lb)     Height 09/10/19 1201 1.499 m (4\' 11" )     Head Circumference --      Peak Flow --      Pain Score 09/10/19 1200 8     Pain Loc --      Pain Edu? --      Excl. in GC? --     Constitutional: Alert and oriented . Nose: No congestion/rhinnorhea. Mouth/Throat: Mucous membranes are moist.    Cardiovascular:  Normal rate, regular rhythm.  Good peripheral circulation. Respiratory: Normal respiratory effort.  No retractions. Gastrointestinal: Mild tenderness periumbilically, no significant distention.  No CVA tenderness.  Musculoskeletal: No lower extremity tenderness nor edema.  Warm and well perfused Neurologic:  Normal speech and language. No gross focal neurologic deficits are appreciated.  Skin:  Skin is warm, dry and intact. No rash noted. Psychiatric: Mood and affect are normal. Speech and behavior are normal.  ____________________________________________   LABS (all labs ordered are listed, but only abnormal results are displayed)  Labs Reviewed  COMPREHENSIVE METABOLIC PANEL - Abnormal; Notable for the following components:      Result Value   Glucose, Bld 163 (*)    All other components within normal limits  CBC - Abnormal; Notable for the following components:   WBC 12.1 (*)    RBC 5.17 (*)    Hemoglobin 10.8 (*)    HCT 34.0 (*)    MCV 65.8 (*)    MCH 20.9 (*)    All other components within normal limits  RESPIRATORY PANEL BY RT PCR (FLU A&B, COVID)  LIPASE, BLOOD  URINALYSIS, COMPLETE (UACMP) WITH MICROSCOPIC   ____________________________________________  EKG  ED ECG REPORT I, 11/10/19, the attending physician, personally viewed and interpreted this ECG.  Date: 09/10/2019  Rhythm: normal sinus rhythm QRS Axis: normal Intervals: normal ST/T Wave abnormalities: normal Narrative Interpretation: no evidence of acute ischemia  ____________________________________________  RADIOLOGY  CT abdomen pelvis ____________________________________________   PROCEDURES  Procedure(s) performed: No  Procedures   Critical Care performed: No ____________________________________________   INITIAL IMPRESSION / ASSESSMENT AND PLAN / ED COURSE  Pertinent labs & imaging results that were available during my care of the patient were reviewed by me and considered in  my medical decision making (see chart for details).  Patient presents with periumbilical abdominal pain, nausea which she reports is similar to prior pain.  Review of medical records demonstrates the patient was admitted for CT scan concerning with small bowel obstruction, surgery felt possible ileus as well.  No fevers or chills.  Certainly small bowel obstruction, ileus, enteritis/colitis are all in the differential.  We will give IV morphine, IV Zofran and reevaluate  Lab work significant for mildly elevated white blood cell count.  CT scan demonstrates partial small bowel obstruction.  Consulted surgery  Patient is doing much better after morphine.  Patient seen by surgery team, they will admit    ____________________________________________   FINAL CLINICAL IMPRESSION(S) / ED DIAGNOSES  Final diagnoses:  Small bowel obstruction (HCC)        Note:  This document was prepared using Dragon voice recognition software and may include unintentional dictation errors.   11/10/2019, MD 09/10/19 1453

## 2019-09-10 NOTE — ED Triage Notes (Signed)
Pt states she is "having the same as last time"- pt having abdominal pain and nausea- pt states the pain started at 0700 and she began vomiting at 0900- pt states she has had 3 BM this AM but not diarrhea-like consistency

## 2019-09-10 NOTE — ED Notes (Signed)
Pt states coming in for stomach pain nausea, but now is pain free and does not have any nausea.

## 2019-09-10 NOTE — ED Notes (Signed)
Hand off of care and report given to Selena Batten, RN

## 2019-09-10 NOTE — H&P (Signed)
[facility] SURGICAL HISTORY & PHYSICAL (cpt [phone])  HISTORY OF PRESENT ILLNESS (HPI):  69 y.o. female presented to [facility] ED today for abdominal pain. Patient reports that earlier today she had the spontaneous onset of peri-umbilical abdominal pain. This was a severe ache. She notes associated nausea and multiple episodes of emesis. She got relief after vomiting but the pain and nausea quickly returned. No fever, chills, cough, congestion, CP, SOB, or urinary changes. She is not passing gas but did have a BM today. Since coming to the ED her symptoms have essentially resolved. She has a history of similar presentation in April and thought to possibly have gastroenteritis which resolved spontaneously without intervention. Previous abdominal surgeries include c-section x2 and today she reports she had two surgeries in the 1980's for what she says was a "blocked fallopian tube." Work up in the ED was concerning for mild leukocytosis to 12K (likely related to vomiting) and CT Abdomen/Pelvis was concerning for dilated loops of small bowel concerning for obstruction.  General surgery is consulted by emergency medicine physician Dr Jene Every, MD for evaluation and management of likely partial small bowel obstruction.    PAST MEDICAL HISTORY (PMH):  Past Medical History:  Diagnosis Date  . Arthritis   . Depression   . Diabetes mellitus without complication (HCC)   . GERD (gastroesophageal reflux disease)   . Hyperlipidemia   . Hyperlipidemia   . Thyroid disorder   . UTI (lower urinary tract infection)     Reviewed. Otherwise negative.   PAST SURGICAL HISTORY (PSH):  Past Surgical History:  Procedure Laterality Date  . CESAREAN SECTION    . ESOPHAGOGASTRODUODENOSCOPY      Reviewed. Otherwise negative.   MEDICATIONS:  Prior to Admission medications   Medication Sig Start Date End Date Taking? Authorizing Provider  Calcium Carbonate-Vitamin D 600-400 MG-UNIT tablet Take by  mouth.    [provider]  Cholecalciferol (VITAMIN D3) 1000 units CAPS Take by mouth.    [provider]  Cyanocobalamin (RA VITAMIN B-12 TR) 1000 MCG TBCR Take by mouth.    [provider]  folic acid (FOLVITE) 800 MCG tablet Take 400 mcg by mouth daily.    [provider]  Multiple Vitamins-Minerals (CENTRUM SILVER ULTRA WOMENS) TABS Take by mouth.    [provider]  omeprazole (PRILOSEC) 40 MG capsule Take 40 mg by mouth daily.    [provider]  simvastatin (ZOCOR) 10 MG tablet Take 10 mg by mouth at bedtime. 05/30/19   [provider]     ALLERGIES:  No Known Allergies   SOCIAL HISTORY:  Social History   Socioeconomic History  . Marital status: Married    Spouse name: Not on file  . Number of children: Not on file  . Years of education: Not on file  . Highest education level: Not on file  Occupational History  . Not on file  Tobacco Use  . Smoking status: Never Smoker  . Smokeless tobacco: Never Used  Substance and Sexual Activity  . Alcohol use: No  . Drug use: No  . Sexual activity: Not on file  Other Topics Concern  . Not on file  Social History Narrative   Lives in [place].    Married.    2 daughters who lives with them.      Diet- generally vegetarian.    Exercise- walks daily   Social Determinants of Health   Financial Resource Strain:   . Difficulty of Paying Living  Expenses:   Food Insecurity:   . Worried About Programme researcher, broadcasting/film/video in the Last Year:   . Barista in the Last Year:   Transportation Needs:   . Freight forwarder (Medical):   Marland Kitchen Lack of Transportation (Non-Medical):   Physical Activity:   . Days of Exercise per Week:   . Minutes of Exercise per Session:   Stress:   . Feeling of Stress :   Social Connections:   . Frequency of Communication with Friends and Family:   . Frequency of Social Gatherings with Friends and Family:   . Attends Religious Services:   .  Active Member of Clubs or Organizations:   . Attends Banker Meetings:   Marland Kitchen Marital Status:   Intimate Partner Violence:   . Fear of Current or Ex-Partner:   . Emotionally Abused:   Marland Kitchen Physically Abused:   . Sexually Abused:      FAMILY HISTORY:  Family History  Problem Relation Age of Onset  . Hyperlipidemia Mother   . Hypertension Mother   . Diabetes Mother   . Hyperlipidemia Father   . Stroke Father   . Hypertension Father   . Diabetes Father   . Breast cancer Neg Hx     Otherwise negative.   REVIEW OF SYSTEMS:  Review of Systems  Constitutional: Negative for chills and fever.  HENT: Negative for congestion and sore throat.   Respiratory: Negative for cough and hemoptysis.   Cardiovascular: Negative for chest pain and palpitations.  Gastrointestinal: Positive for abdominal pain, nausea and vomiting. Negative for blood in stool, constipation and diarrhea.  Genitourinary: Negative for dysuria and urgency.  All other systems reviewed and are negative.   VITAL SIGNS:  Temp:  [97.7 F (36.5 C)] 97.7 F (36.5 C) (05/06 1202) Pulse Rate:  [67-72] 69 (05/06 1400) Resp:  [16] 16 (05/06 1202) BP: (113-140)/(59-86) 113/59 (05/06 1400) SpO2:  [96 %-100 %] 97 % (05/06 1400) Weight:  [49.9 kg] 49.9 kg (05/06 1201)     Height: 4\' 11"  (149.9 cm) Weight: 49.9 kg BMI (Calculated): 22.21   PHYSICAL EXAM:  Physical Exam Vitals and nursing note reviewed.  Constitutional:      General: She is not in acute distress.    Appearance: She is well-developed and normal weight. She is not ill-appearing.  HENT:     Head: Normocephalic and atraumatic.  Eyes:     General: No scleral icterus.    Extraocular Movements: Extraocular movements intact.  Cardiovascular:     Rate and Rhythm: Normal rate and regular rhythm.     Heart sounds: Normal heart sounds. No murmur.  Pulmonary:     Effort: Pulmonary effort is normal. No respiratory distress.     Breath sounds: Normal breath  sounds.  Abdominal:     General: Abdomen is flat. A surgical scar is present. There is no distension.     Palpations: Abdomen is soft.     Tenderness: There is no abdominal tenderness. There is no guarding or rebound.  Genitourinary:    Comments: Deferred  Skin:    General: Skin is warm and dry.     Coloration: Skin is not jaundiced or pale.  Neurological:     General: No focal deficit present.     Mental Status: She is alert and oriented to person, place, and time.  Psychiatric:        Mood and Affect: Mood normal.        Behavior:  Behavior normal.     INTAKE/OUTPUT:  This shift: No intake/output data recorded.  Last 2 shifts: @IOLAST2SHIFTS @  Labs:  CBC Latest Ref Rng & Units 09/10/2019 08/20/2019 08/19/2019  WBC 4.0 - 10.5 K/uL 12.1(H) 5.7 14.4(H)  Hemoglobin 12.0 - 15.0 g/dL 10.8(L) 9.2(L) 11.4(L)  Hematocrit 36.0 - 46.0 % 34.0(L) 28.9(L) 35.9(L)  Platelets 150 - 400 K/uL 307 251 318   CMP Latest Ref Rng & Units 09/10/2019 08/20/2019 08/19/2019  Glucose 70 - 99 mg/dL 163(H) 97 204(H)  BUN 8 - 23 mg/dL 17 11 22   Creatinine 0.44 - 1.00 mg/dL 0.53 0.67 0.67  Sodium 135 - 145 mmol/L 138 139 135  Potassium 3.5 - 5.1 mmol/L 4.2 3.5 4.0  Chloride 98 - 111 mmol/L 103 104 99  CO2 22 - 32 mmol/L 27 28 24   Calcium 8.9 - 10.3 mg/dL 9.5 8.6(L) 9.4  Total Protein 6.5 - 8.1 g/dL 7.3 - 7.8  Total Bilirubin 0.3 - 1.2 mg/dL 1.1 - 1.0  Alkaline Phos 38 - 126 U/L 55 - 67  AST 15 - 41 U/L 16 - 20  ALT 0 - 44 U/L 17 - 17     Imaging studies:   CT Abdomen/Pelvis (09/10/2019) personally reviewed showing multiple loops of small bowel with transition likely in the pelvis, and radiologist report reviewed:  IMPRESSION: 1. Partial small bowel obstruction with 2 transition points in the inferior right pelvis. The current appearance suggests obstruction due to adhesions. 2. Small amount of free peritoneal fluid in the inferior pelvis. 3. Extensive colonic diverticulosis.   Assessment/Plan:  (ICD-10's: K44.600) 69 y.o. 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 He symptoms are currently resolved, and so I think we can hold off on this for now. If her symptoms return or she were to worsen we will place this.   - NPO for today  - IVF resuscitation    - Monitor abdominal examination; on-going bowel function  - Pain control prn; antiemetics prn  - Will get morning KUB   - No emergent surgical intervention  - medical management of comorbidities; home meds   - DVT prophylaxis  All of the above findings and recommendations were discussed with the patient and her daughter, and all of their questions were answered to their expressed satisfaction.  -- Edison Simon, PA-C [facility] 09/10/2019, 2:30 PM [phone] M-F: 7am - 4pm

## 2019-09-11 ENCOUNTER — Observation Stay: Payer: Medicare Other

## 2019-09-11 DIAGNOSIS — D563 Thalassemia minor: Secondary | ICD-10-CM | POA: Diagnosis present

## 2019-09-11 DIAGNOSIS — E119 Type 2 diabetes mellitus without complications: Secondary | ICD-10-CM | POA: Diagnosis present

## 2019-09-11 DIAGNOSIS — K56600 Partial intestinal obstruction, unspecified as to cause: Principal | ICD-10-CM

## 2019-09-11 DIAGNOSIS — K5730 Diverticulosis of large intestine without perforation or abscess without bleeding: Secondary | ICD-10-CM | POA: Diagnosis present

## 2019-09-11 DIAGNOSIS — E02 Subclinical iodine-deficiency hypothyroidism: Secondary | ICD-10-CM | POA: Diagnosis present

## 2019-09-11 DIAGNOSIS — Z8249 Family history of ischemic heart disease and other diseases of the circulatory system: Secondary | ICD-10-CM | POA: Diagnosis not present

## 2019-09-11 DIAGNOSIS — Z833 Family history of diabetes mellitus: Secondary | ICD-10-CM | POA: Diagnosis not present

## 2019-09-11 DIAGNOSIS — Z83438 Family history of other disorder of lipoprotein metabolism and other lipidemia: Secondary | ICD-10-CM | POA: Diagnosis not present

## 2019-09-11 DIAGNOSIS — M810 Age-related osteoporosis without current pathological fracture: Secondary | ICD-10-CM | POA: Diagnosis present

## 2019-09-11 DIAGNOSIS — K219 Gastro-esophageal reflux disease without esophagitis: Secondary | ICD-10-CM | POA: Diagnosis present

## 2019-09-11 DIAGNOSIS — K56609 Unspecified intestinal obstruction, unspecified as to partial versus complete obstruction: Secondary | ICD-10-CM | POA: Diagnosis present

## 2019-09-11 DIAGNOSIS — F329 Major depressive disorder, single episode, unspecified: Secondary | ICD-10-CM | POA: Diagnosis present

## 2019-09-11 DIAGNOSIS — E785 Hyperlipidemia, unspecified: Secondary | ICD-10-CM | POA: Diagnosis present

## 2019-09-11 DIAGNOSIS — Z20822 Contact with and (suspected) exposure to covid-19: Secondary | ICD-10-CM | POA: Diagnosis present

## 2019-09-11 DIAGNOSIS — Z79899 Other long term (current) drug therapy: Secondary | ICD-10-CM | POA: Diagnosis not present

## 2019-09-11 LAB — BASIC METABOLIC PANEL
Anion gap: 5 (ref 5–15)
BUN: 15 mg/dL (ref 8–23)
CO2: 28 mmol/L (ref 22–32)
Calcium: 8.6 mg/dL — ABNORMAL LOW (ref 8.9–10.3)
Chloride: 106 mmol/L (ref 98–111)
Creatinine, Ser: 0.67 mg/dL (ref 0.44–1.00)
GFR calc Af Amer: >60 mL/min (ref >60)
GFR calc non Af Amer: >60 mL/min (ref >60)
Glucose, Bld: 100 mg/dL — ABNORMAL HIGH (ref 70–99)
Potassium: 4 mmol/L (ref 3.5–5.1)
Sodium: 139 mmol/L (ref 135–145)

## 2019-09-11 LAB — CBC
HCT: 30.7 % — ABNORMAL LOW (ref 36.0–46.0)
Hemoglobin: 9.8 g/dL — ABNORMAL LOW (ref 12.0–15.0)
MCH: 21.3 pg — ABNORMAL LOW (ref 26.0–34.0)
MCHC: 31.9 g/dL (ref 30.0–36.0)
MCV: 66.6 fL — ABNORMAL LOW (ref 80.0–100.0)
Platelets: 264 10*3/uL (ref 150–400)
RBC: 4.61 MIL/uL (ref 3.87–5.11)
RDW: 15.4 % (ref 11.5–15.5)
WBC: 4.8 10*3/uL (ref 4.0–10.5)
nRBC: 0 % (ref 0.0–0.2)

## 2019-09-11 NOTE — Progress Notes (Signed)
[facility] SURGICAL PROGRESS NOTE (cpt [phone])  Hospital Day(s): 0.   Interval History: Patient seen and examined, no acute events or new complaints overnight. Patient reports she is feeling much better this morning. No fever, chills, nausea, emesis, or abdominal pain. She has continued to have bowel function. KUB is improved. Labs reassuring. No issues.   Review of Systems:  Constitutional: denies fever, chills  HEENT: denies cough or congestion  Respiratory: denies any shortness of breath  Cardiovascular: denies chest pain or palpitations  Gastrointestinal: denies abdominal pain, N/V, or diarrhea/and bowel function as per interval history Genitourinary: denies burning with urination or urinary frequency   Vital signs in last 24 hours: [min-max] current  Temp:  [97.6 F (36.4 C)-97.9 F (36.6 C)] 97.9 F (36.6 C) (05/07 0426) Pulse Rate:  [60-83] 73 (05/07 0426) Resp:  [16-20] 18 (05/07 0426) BP: (113-140)/(59-86) 125/65 (05/07 0426) SpO2:  [96 %-100 %] 100 % (05/07 0426) Weight:  [49.9 kg] 49.9 kg (05/06 1201)     Height: 4\' 11"  (149.9 cm) Weight: 49.9 kg BMI (Calculated): 22.21   Intake/Output last 2 shifts:  05/06 0701 - 05/07 0700 In: 1000 [I.V.:1000] Out: -    Physical Exam:  Constitutional: alert, cooperative and no distress  HENT: normocephalic without obvious abnormality  Eyes: PERRL, EOM's grossly intact and symmetric  Respiratory: breathing non-labored at rest  Cardiovascular: regular rate and sinus rhythm  Gastrointestinal: Soft, non-tender, and non-distended, no rebound/guarding Musculoskeletal: No edema or wounds, motor and sensation grossly intact, NT    Labs:  CBC Latest Ref Rng & Units 09/11/2019 09/10/2019 08/20/2019  WBC 4.0 - 10.5 K/uL 4.8 12.1(H) 5.7  Hemoglobin 12.0 - 15.0 g/dL 9.8(L) 10.8(L) 9.2(L)  Hematocrit 36.0 - 46.0 % 30.7(L) 34.0(L) 28.9(L)  Platelets 150 - 400 K/uL 264 307 251   CMP Latest Ref Rng & Units 09/11/2019 09/10/2019  08/20/2019  Glucose 70 - 99 mg/dL 100(H) 163(H) 97  BUN 8 - 23 mg/dL 15 17 11   Creatinine 0.44 - 1.00 mg/dL 0.67 0.53 0.67  Sodium 135 - 145 mmol/L 139 138 139  Potassium 3.5 - 5.1 mmol/L 4.0 4.2 3.5  Chloride 98 - 111 mmol/L 106 103 104  CO2 22 - 32 mmol/L 28 27 28   Calcium 8.9 - 10.3 mg/dL 8.6(L) 9.5 8.6(L)  Total Protein 6.5 - 8.1 g/dL - 7.3 -  Total Bilirubin 0.3 - 1.2 mg/dL - 1.1 -  Alkaline Phos 38 - 126 U/L - 55 -  AST 15 - 41 U/L - 16 -  ALT 0 - 44 U/L - 17 -     Imaging studies:   KUB (09/11/2019) personally reviewed showing improvement in bowel distension and stool in colon, and radiologist report reviewed:  IMPRESSION: Interval resolution of bowel distention. No bowel distention noted a is exam. Stool noted throughout the colon. No free air.   Assessment/Plan: (ICD-10's: K1.600) 69 y.o. 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   - Initiate CLD; ADAT today  - Discontinue IVF  - No emergent surgical intervention; if she recurs in the near future we may need to consider intervention, at minimum diagnostic laparoscopy  - pain control prn; antiemetics prn  - monitor abdominal examination; on-going bowel function   - medical management of comorbidities; home medications   - mobilize   - Discharge Planning: Advance diet today, home in AM if clinically doing well   All of the above findings and recommendations were discussed  with the patient, patient's family (daughter - at bedside), and the medical team, and all of patient's and family's questions were answered to their expressed satisfaction.  -- Lynden Oxford, PA-C [facility] 09/11/2019, 8:09 AM [phone] M-F: 7am - 4pm

## 2019-09-12 NOTE — Plan of Care (Signed)
Continuing with plan of care. 

## 2019-09-12 NOTE — Plan of Care (Signed)
Discharge teaching completed with patient who verbalized understanding of teaching.  Patient is in stable condition and has all belongings. 

## 2019-09-12 NOTE — Discharge Summary (Signed)
  Patient ID: Janet Moyer MRN: [id number] DOB/AGE: 1950/08/11 69 y.o.  Admit date: 09/10/2019 Discharge date: 09/12/2019   Discharge Diagnoses:  Active Problems:   Partial small bowel obstruction (HCC)   Procedures: None,  Hospital Course:   Admitted with findings consistent with partial small bowel obstruction and  was placed on bowel rest and observed. Follow-up imaging confirmed resolution, NG clamping trial was well-tolerated. Patient started on clear liquids, advance to full liquids and tolerated well. She is concerned because this is a 2nd admission within a month for the same issue. But she would like to avoid things in her diet that might be contributing to it and this was discussed in detail along with the risks of recurrence.  The time of discharge the patient was ambulating,  pain was controlled.  Her vital signs were stable and she was afebrile.   physical exam at discharge showed a pt  in no acute distress.  Awake and alert.  Abdomen: Soft incisions healing well without infection or peritonitis.  Extremities well-perfused and no edema.  Condition of the patient the time of discharge was stable   Consults:   Disposition: Discharge disposition: 01-Home or Self Care       Discharge Instructions    Call MD for:  persistant nausea and vomiting   Complete by: As directed    Diet - low sodium heart healthy   Complete by: As directed    Discharge instructions   Complete by: As directed    Plan as discussed, avoid poorly digested skin from various vegetables and fruit.   Increase activity slowly   Complete by: As directed      Allergies as of 09/12/2019   No Known Allergies     Medication List    TAKE these medications   Calcium Carbonate-Vitamin D 600-400 MG-UNIT tablet Take by mouth.   Centrum Silver Ultra Womens Tabs Take by mouth.   folic acid 800 MCG tablet Commonly known as: FOLVITE Take 400 mcg by mouth daily.   omeprazole 40 MG capsule Commonly known  as: PRILOSEC Take 40 mg by mouth daily.   RA Vitamin B-12 TR 1000 MCG Tbcr Generic drug: Cyanocobalamin Take by mouth.   simvastatin 10 MG tablet Commonly known as: ZOCOR Take 10 mg by mouth at bedtime.   Vitamin D3 25 MCG (1000 UT) Caps Take by mouth.      Follow-up Information    P[place], [place] F, MD Follow up in 1 week(s).   Specialty: General Surgery Contact information: [address] [place] [address] [phone]

## 2019-10-07 ENCOUNTER — Other Ambulatory Visit: Payer: Self-pay

## 2019-10-07 ENCOUNTER — Ambulatory Visit
Admission: RE | Admit: 2019-10-07 | Discharge: 2019-10-07 | Disposition: A | Discharge status: Home / Self Care | Payer: Medicare Other | Source: Ambulatory Visit | Attending: Family Medicine | Admitting: Family Medicine

## 2019-10-07 ENCOUNTER — Ambulatory Visit (INDEPENDENT_AMBULATORY_CARE_PROVIDER_SITE_OTHER): Payer: Medicare Other | Admitting: Surgery

## 2019-10-07 ENCOUNTER — Encounter: Payer: Self-pay | Admitting: Surgery

## 2019-10-07 DIAGNOSIS — K56609 Unspecified intestinal obstruction, unspecified as to partial versus complete obstruction: Secondary | ICD-10-CM

## 2019-10-07 DIAGNOSIS — Z780 Asymptomatic menopausal state: Secondary | ICD-10-CM

## 2019-10-07 NOTE — Patient Instructions (Addendum)
Your CT Abdomen and Pelvis with contrast appointment is scheduled for 10/14/19 at the [facility]. Arrival at 9:15am. Nothing to eat or drink 4 hours prior.   You will need to pick up your prep kit before appointment either at the [place] or [facility].   Please see your appointment below. Call the office if you have any questions or concerns.

## 2019-10-09 ENCOUNTER — Encounter: Payer: Self-pay | Admitting: Surgery

## 2019-10-09 NOTE — Progress Notes (Signed)
Outpatient Surgical Follow Up  10/09/2019  Janet Moyer is an 69 y.o. female.   Chief Complaint  Patient presents with  . Hospitalization Follow-up    Small Bowel Obstruction     HPI: This Vicars is a 69 year old female well-known to me with significant history of recurrent small bowel obstructions.  She did have a history of C-section and pelvic surgery which we think is a fallopian tube surgery performed more than 30 years ago.  He was recently hospitalized for bowel obstruction and again I have personally reviewed the CT scan of the abdomen from month ago showing evidence of a small bowel obstruction with transition point inferior right pelvis. She is currently pain-free.  She is taking diet normally.  She is a vegetarian.  She is able to perform more than 4 METS of activity without any shortness of breath or chest pain.  She has had normal bowel movements.  She is accompanied with her daughter who is an ICU nurse in our institution.  Past Medical History:  Diagnosis Date  . Arthritis   . Depression   . Diabetes mellitus without complication (HCC)   . GERD (gastroesophageal reflux disease)   . Hyperlipidemia   . Hyperlipidemia   . Thyroid disorder   . UTI (lower urinary tract infection)     Past Surgical History:  Procedure Laterality Date  . CESAREAN SECTION    . ESOPHAGOGASTRODUODENOSCOPY      Family History  Problem Relation Age of Onset  . Hyperlipidemia Mother   . Hypertension Mother   . Diabetes Mother   . Hyperlipidemia Father   . Stroke Father   . Hypertension Father   . Diabetes Father   . Breast cancer Neg Hx     Social History:  reports that she has never smoked. She has never used smokeless tobacco. She reports that she does not drink alcohol or use drugs.  Allergies: No Known Allergies  Medications reviewed.    ROS Full ROS performed and is otherwise negative other than what is stated in HPI   BP 134/77   Pulse 73   Temp (!) 97.5 F (36.4 C)    Resp 12   Ht 4\' 11"  (1.499 m)   Wt 108 lb 3.2 oz (49.1 kg)   SpO2 99%   BMI 21.85 kg/m   Physical Exam Vitals and nursing note reviewed.  Constitutional:      General: She is not in acute distress.    Appearance: Normal appearance. She is normal weight.  Eyes:     General: No scleral icterus.       Right eye: No discharge.        Left eye: No discharge.     Extraocular Movements: Extraocular movements intact.  Pulmonary:     Effort: Pulmonary effort is normal. No respiratory distress.  Abdominal:     General: Abdomen is flat. There is no distension.     Palpations: Abdomen is soft. There is no mass.     Tenderness: There is no abdominal tenderness. There is no guarding or rebound.     Hernia: No hernia is present.  Musculoskeletal:        General: No swelling or tenderness. Normal range of motion.  Skin:    General: Skin is warm and dry.     Capillary Refill: Capillary refill takes less than 2 seconds.  Neurological:     General: No focal deficit present.     Mental Status: She is alert and  oriented to person, place, and time.  Psychiatric:        Mood and Affect: Mood normal.        Behavior: Behavior normal.        Thought Content: Thought content normal.        Judgment: Judgment normal.     Assessment/Plan: Currently small bowel obstruction in a patient with previous pelvic surgery.  Currently she is doing well.  Given the recurrence of small bowel obstruction I had an extensive discussion with the patient and the daughter who is one of our ICU nurses.  I did offer a repeat CT scan of the abdomen and pelvis with potential in a meeting to identify the adhesive disease.  If we are able to identify of the adhesion we might be able to targeted and perform a robotic procedure.  We will see her back in a few weeks with results. Greater than 50% of the 40 minutes  visit was spent in counseling/coordination of care   Caroleen Hamman, MD [facility]

## 2019-10-13 ENCOUNTER — Encounter: Payer: Self-pay | Admitting: Emergency Medicine

## 2019-10-14 ENCOUNTER — Ambulatory Visit
Admission: RE | Admit: 2019-10-14 | Discharge: 2019-10-14 | Disposition: A | Discharge status: Home / Self Care | Payer: Medicare Other | Source: Ambulatory Visit | Attending: Surgery | Admitting: Surgery

## 2019-10-14 ENCOUNTER — Other Ambulatory Visit: Payer: Self-pay

## 2019-10-14 DIAGNOSIS — K56609 Unspecified intestinal obstruction, unspecified as to partial versus complete obstruction: Secondary | ICD-10-CM

## 2019-10-14 LAB — POCT I-STAT CREATININE: Creatinine, Ser: 0.6 mg/dL (ref 0.44–1.00)

## 2019-10-14 MED ORDER — IOHEXOL 300 MG/ML  SOLN
75.0000 mL | Freq: Once | INTRAMUSCULAR | Status: AC | PRN
  Administered 2019-10-14: 75 mL via INTRAVENOUS

## 2019-10-15 ENCOUNTER — Telehealth: Payer: Self-pay | Admitting: Emergency Medicine

## 2019-10-15 NOTE — Telephone Encounter (Signed)
Called with no answer. Left detailed message and advised to call the office back if they have any questions or concerns.

## 2019-10-15 NOTE — Telephone Encounter (Signed)
-----   Message from Leafy Ro, MD sent at 10/15/2019 12:26 PM EDT ----- Please let her know that her CT scan did not show any obstruction or anything acute, will discuss furhter on f/u appt ----- Message ----- From: Interface, Rad Results In Sent: 10/15/2019   8:54 AM EDT To: Leafy Ro, MD

## 2019-10-21 ENCOUNTER — Ambulatory Visit: Payer: Medicare Other | Admitting: Surgery

## 2019-10-28 ENCOUNTER — Ambulatory Visit (INDEPENDENT_AMBULATORY_CARE_PROVIDER_SITE_OTHER): Payer: Medicare Other | Admitting: Surgery

## 2019-10-28 ENCOUNTER — Encounter: Payer: Self-pay | Admitting: Surgery

## 2019-10-28 ENCOUNTER — Other Ambulatory Visit: Payer: Self-pay

## 2019-10-28 VITALS — BP 146/73 | HR 74 | Temp 97.7°F | Resp 12 | Ht 59.0 in | Wt 107.0 lb

## 2019-10-28 DIAGNOSIS — K56600 Partial intestinal obstruction, unspecified as to cause: Secondary | ICD-10-CM | POA: Diagnosis not present

## 2019-10-28 NOTE — Patient Instructions (Signed)
Please call with any questions or concerns.

## 2019-10-30 NOTE — Progress Notes (Signed)
Outpatient Surgical Follow Up  10/30/2019  Janet Moyer is an 69 y.o. female.   Chief Complaint  Patient presents with  . Follow-up    SBO- discuss CT A&P    HPI: Recurrent small bowel obstruction.  Recent CT did not show any evidence o obstruction or pain point adhesion.  I do suspect that adhesions is mainly located within the pelvic area due to pelvic surgery in the past.  She is taking p.o. she is having regular bowel movements.  No fevers no chills.  Past Medical History:  Diagnosis Date  . Arthritis   . Depression   . Diabetes mellitus without complication ([place])   . GERD (gastroesophageal reflux disease)   . Hyperlipidemia   . Hyperlipidemia   . Thyroid disorder   . UTI (lower urinary tract infection)     Past Surgical History:  Procedure Laterality Date  . CESAREAN SECTION    . ESOPHAGOGASTRODUODENOSCOPY      Family History  Problem Relation Age of Onset  . Hyperlipidemia Mother   . Hypertension Mother   . Diabetes Mother   . Hyperlipidemia Father   . Stroke Father   . Hypertension Father   . Diabetes Father   . Breast cancer Neg Hx     Social History:  reports that she has never smoked. She has never used smokeless tobacco. She reports that she does not drink alcohol and does not use drugs.  Allergies: No Known Allergies  Medications reviewed.    ROS Full ROS performed and is otherwise negative other than what is stated in HPI   BP (!) 146/73   Pulse 74   Temp 97.7 F (36.5 C) (Oral)   Resp 12   Ht 4\' 11"  (1.499 m)   Wt 107 lb (48.5 kg)   SpO2 98%   BMI 21.61 kg/m   Physical Exam Vitals and nursing note reviewed. Exam conducted with a chaperone present.  Constitutional:      General: She is not in acute distress.    Appearance: Normal appearance. She is normal weight.  Cardiovascular:     Rate and Rhythm: Normal rate and regular rhythm.  Pulmonary:     Effort: Pulmonary effort is normal. No respiratory distress.     Breath sounds:  Normal breath sounds. No stridor.  Abdominal:     General: Abdomen is flat. There is no distension.     Palpations: Abdomen is soft. There is no mass.     Tenderness: There is no abdominal tenderness.     Hernia: No hernia is present.  Musculoskeletal:     Cervical back: Normal range of motion and neck supple. No rigidity or tenderness.  Skin:    General: Skin is warm and dry.     Capillary Refill: Capillary refill takes less than 2 seconds.  Neurological:     General: No focal deficit present.     Mental Status: She is alert and oriented to person, place, and time.  Psychiatric:        Mood and Affect: Mood normal.        Judgment: Judgment normal.       Assessment/Plan: Recurrent small bowel obstruction from previous pelvic surgery.  Recent CT did not demonstrate definite transition point.  Discussed with them in detail about the role of elective laparoscopic lysis of adhesions in the setting of recurrent bowel obstruction.  Specifically stated that this is a little bit of a gray area, the options are to wait versus  laparoscopic lysis of adhesions.  We Had an extensive discussion with the patient and the family about each of them the pros and cons.  They wish to think about it and will call back.  She is in no need for any urgent surgical intervention at this time   Greater than 50% of the 30 minutes  visit was spent in counseling/coordination of care   Sterling Big, MD [facility]

## 2021-07-26 ENCOUNTER — Other Ambulatory Visit: Payer: Self-pay | Admitting: Family Medicine

## 2021-07-26 DIAGNOSIS — Z780 Asymptomatic menopausal state: Secondary | ICD-10-CM

## 2021-07-26 DIAGNOSIS — Z1231 Encounter for screening mammogram for malignant neoplasm of breast: Secondary | ICD-10-CM

## 2021-09-18 ENCOUNTER — Other Ambulatory Visit: Payer: Medicare Other

## 2021-11-08 ENCOUNTER — Ambulatory Visit
Admission: RE | Admit: 2021-11-08 | Discharge: 2021-11-08 | Disposition: A | Discharge status: Home / Self Care | Payer: Medicare HMO | Source: Ambulatory Visit | Attending: Family Medicine | Admitting: Family Medicine

## 2021-11-08 DIAGNOSIS — Z780 Asymptomatic menopausal state: Secondary | ICD-10-CM | POA: Diagnosis present

## 2021-11-08 DIAGNOSIS — Z1231 Encounter for screening mammogram for malignant neoplasm of breast: Secondary | ICD-10-CM | POA: Insufficient documentation
